# Patient Record
Sex: Male | Born: 1950
Health system: Southern US, Community
[De-identification: ages and names within clinical notes are randomized; demographics above are authoritative.]

## PROBLEM LIST (undated history)

## (undated) DIAGNOSIS — E11621 Type 2 diabetes mellitus with foot ulcer: Secondary | ICD-10-CM

## (undated) DIAGNOSIS — G4730 Sleep apnea, unspecified: Secondary | ICD-10-CM

## (undated) DIAGNOSIS — I890 Lymphedema, not elsewhere classified: Secondary | ICD-10-CM

## (undated) DIAGNOSIS — I10 Essential (primary) hypertension: Secondary | ICD-10-CM

## (undated) DIAGNOSIS — A419 Sepsis, unspecified organism: Secondary | ICD-10-CM

## (undated) DIAGNOSIS — E7800 Pure hypercholesterolemia, unspecified: Secondary | ICD-10-CM

## (undated) DIAGNOSIS — R7881 Bacteremia: Secondary | ICD-10-CM

## (undated) DIAGNOSIS — L97509 Non-pressure chronic ulcer of other part of unspecified foot with unspecified severity: Secondary | ICD-10-CM

## (undated) DIAGNOSIS — E1151 Type 2 diabetes mellitus with diabetic peripheral angiopathy without gangrene: Secondary | ICD-10-CM

## (undated) DIAGNOSIS — R6521 Severe sepsis with septic shock: Secondary | ICD-10-CM

## (undated) DIAGNOSIS — E669 Obesity, unspecified: Secondary | ICD-10-CM

## (undated) HISTORY — DX: Essential (primary) hypertension: I10

## (undated) HISTORY — DX: Obesity, unspecified: E66.9

## (undated) HISTORY — DX: Sleep apnea, unspecified: G47.30

## (undated) HISTORY — PX: KNEE SURGERY: SHX244

## (undated) HISTORY — PX: HEMORRHOID SURGERY: SHX153

---

## 2002-01-03 ENCOUNTER — Observation Stay (HOSPITAL_COMMUNITY): Admission: EM | Admit: 2002-01-03 | Discharge: 2002-01-04 | Payer: Self-pay | Admitting: Emergency Medicine

## 2002-01-03 ENCOUNTER — Emergency Department (HOSPITAL_COMMUNITY): Admission: EM | Admit: 2002-01-03 | Discharge: 2002-01-03 | Payer: Self-pay | Admitting: Emergency Medicine

## 2002-01-03 ENCOUNTER — Encounter: Payer: Self-pay | Admitting: Emergency Medicine

## 2003-07-22 ENCOUNTER — Encounter (HOSPITAL_BASED_OUTPATIENT_CLINIC_OR_DEPARTMENT_OTHER): Admission: RE | Admit: 2003-07-22 | Discharge: 2003-09-10 | Payer: Self-pay | Admitting: Internal Medicine

## 2005-01-04 ENCOUNTER — Encounter (HOSPITAL_COMMUNITY): Admission: RE | Admit: 2005-01-04 | Discharge: 2005-02-03 | Payer: Self-pay | Admitting: Family Medicine

## 2005-01-05 ENCOUNTER — Ambulatory Visit (HOSPITAL_COMMUNITY): Admission: RE | Admit: 2005-01-05 | Discharge: 2005-01-05 | Payer: Self-pay | Admitting: Family Medicine

## 2006-10-05 ENCOUNTER — Inpatient Hospital Stay (HOSPITAL_COMMUNITY): Admission: EM | Admit: 2006-10-05 | Discharge: 2006-10-10 | Payer: Self-pay | Admitting: Emergency Medicine

## 2006-10-11 ENCOUNTER — Encounter (HOSPITAL_COMMUNITY): Admission: RE | Admit: 2006-10-11 | Discharge: 2006-11-10 | Payer: Self-pay | Admitting: Family Medicine

## 2006-10-18 ENCOUNTER — Ambulatory Visit (HOSPITAL_COMMUNITY): Admission: RE | Admit: 2006-10-18 | Discharge: 2006-10-18 | Payer: Self-pay | Admitting: Podiatry

## 2006-11-14 ENCOUNTER — Encounter (HOSPITAL_COMMUNITY): Admission: RE | Admit: 2006-11-14 | Discharge: 2006-12-14 | Payer: Self-pay | Admitting: Family Medicine

## 2007-09-16 ENCOUNTER — Ambulatory Visit (HOSPITAL_COMMUNITY): Admission: RE | Admit: 2007-09-16 | Discharge: 2007-09-16 | Payer: Self-pay | Admitting: Podiatry

## 2008-02-28 ENCOUNTER — Emergency Department (HOSPITAL_COMMUNITY): Admission: EM | Admit: 2008-02-28 | Discharge: 2008-02-28 | Payer: Self-pay | Admitting: Emergency Medicine

## 2010-11-04 NOTE — H&P (Signed)
Thomas Carey, Thomas Carey             ACCOUNT NO.:  [id number]   MEDICAL RECORD NO.:  [id number]          PATIENT TYPE:  INP   LOCATION:  A219                          FACILITY:  [facility]   PHYSICIAN:  Kirk Ruths, M.D.DATE OF BIRTH:  February 21, 1951   DATE OF ADMISSION:  10/05/2006  DATE OF DISCHARGE:  LH                              HISTORY & PHYSICAL   CHIEF COMPLAINT:  1. Weakness.  2. Chills.  3. Fever.   PRESENTING ILLNESS:  This is a 60 year old morbidly obese patient with  metabolic syndrome and history of diabetic foot ulcers.  The patient  awoke this morning, started feeling weak with chills and rigor and  fever, went to the Urgent Care Center and was subsequently referred to  the ER where he was found to be somewhat hypotensive, blood pressure in  the 80's systolic, weak with an open and draining diabetic ulcer on his  right foot.  The patient has had chronic problems with his foot for  several years.  He is admitted for IV antibiotics and fluids for  possible sepsis.   PAST MEDICAL HISTORY:  He is allergic to no medications.  Currently  takes metformin, TriCor, aspirin, Benicar and Vytorin, doses are  unavailable at the time of this dictation.   REVIEW OF SYSTEMS:  Denies shortness of breath, chest pains, nausea,  vomiting, diarrhea.   SOCIAL HISTORY:  1. Nonsmoker.  2. Nondrinker.   PHYSICAL EXAMINATION:  Morbidly obese male appears miserable.  Temp is  99, blood pressure 103/47, pulse 94, respirations 22, O2 sats 92% on  room air.  HEENT:  TMs are normal.  Pupils equal and react to light and  accommodation.  Oropharynx benign.  NECK:  Supple without JVD, bruit or thyromegaly.  LUNGS:  Clear in all areas.  HEART:  Regular sinus rhythm without murmur, gallop or rub.  ABDOMEN:  Pendulous, nontender.  There is obvious cellulitis in the  right lower extremity and foot with purulent drainage.   ASSESSMENT:  1. Diabetic foot ulcer.  2. Possible sepsis with  hypotension.  3. Metabolic syndrome.      Kirk Ruths, M.D.  Electronically Signed     WMM/MEDQ  D:  10/06/2006  T:  10/06/2006  Job:  [id number]

## 2010-11-04 NOTE — Discharge Summary (Signed)
Thomas Carey, Thomas Carey             ACCOUNT NO.:  [id number]   MEDICAL RECORD NO.:  [id number]          PATIENT TYPE:  INP   LOCATION:  A219                          FACILITY:  [facility]   PHYSICIAN:  Kirk Ruths, M.D.DATE OF BIRTH:  04-Oct-1950   DATE OF ADMISSION:  10/05/2006  DATE OF DISCHARGE:  04/23/2008LH                               DISCHARGE SUMMARY   FINAL DIAGNOSES:  1. Sepsis with hypotension secondary to staph aureus.  2. Diabetic foot ulcer with cellulitis secondary to staph aureus.  3. Type 2 diabetes.  4. Morbid obesity.  5. Hypertension.  6. Hyperlipidemia.   HOSPITAL COURSE:  This 60 year old male has had multiple problems in the  past from a diabetic foot ulcers.  The patient on this occasion was  having some fever and chills; was seen in an Urgent Care Center earlier  on this day.  Developed worsening chills and rigor and weakness.  In the  emergency room, he was found to have a systolic pressure of 80 and a  severe diabetic foot ulcer.  It was felt patient was having septicemia  with hypotension.  He was admitted to the floor; given copious fluids.  The patient was seen in consultation by Dr. Malvin Johns for surgery and  basically between him and Physical Therapy the wound was treated,  debrided, cultured.  The patient subsequently grew out staph aureus in  his blood and his wound.  This was sensitive to most drugs except  penicillin and erythromycin.  The patient had been empirically treated  with IV antibiotics on admission.  Assuming this was a MRSA he had been  started on vancomycin and Rocephin IV.  The vancomycin was discontinued  after sensitivities returned.  The patient's blood pressure returned to  normal by the following day, he was having no complaints.  The patient's  blood sugars were initially up but were controlled through his stay.  His glycosylated hemoglobin was 6.3.  X-ray of his foot showed no  evidence of bony involvement.  Chest x-ray  showed some cardiomegaly.  Echocardiogram to rule out a vegetative lesion of his sepsis was mildly  abnormal but no evidence of septic source.  The patient's initial white  count was 13.1 with a hemoglobin of 13.4.  Electrolytes were within  normal range.  The patient reached maximal hospital benefit.  He was  discharged home on Levaquin and he is to continue his wound care at  Physical Therapy Department.  He will followup with Surgery at [facility]  in 1 week.      Kirk Ruths, M.D.  Electronically Signed     WMM/MEDQ  D:  10/29/2006  T:  10/29/2006  Job:  [id number]

## 2010-11-04 NOTE — Consult Note (Signed)
NAME:  Thomas Carey, Thomas Carey                       ACCOUNT NO.:  [id number]   MEDICAL RECORD NO.:  [id number]                   PATIENT TYPE:  REC   LOCATION:  FOOT                                 FACILITY:  [facility]   PHYSICIAN:  Jonelle Sports. Sevier, M.D.              DATE OF BIRTH:  July 08, 1950   DATE OF CONSULTATION:  07/23/2003  DATE OF DISCHARGE:                                   CONSULTATION   HISTORY:  This 60 year old white male comes referred by Carilyn Goodpasture for  assistance with management of a chronic ulcer on the plantar aspect of the  right foot.   The patient is not diabetic but has been overweight for a number of years.  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  Over the past two  years, he has had a tendency toward recurring callus and ulceration on the  right first metatarsal head area.  This was never a matter of great concern.  He would either trim it himself or have it trimmed, and it would seem to  heal over until recently when he awakened in the middle of the night with  shaking chills and was seen at the hospital.  No certain diagnosis  established.  Temperature came down.  He was sent home only to have  recurrence of chills and eventual rupture and drainage of an abscess at the  site of this callus of his first metatarsal head area on the right.  Since  then, he has been seen by two podiatrists with some degree of debridement  and has been kept on antibiotics, initially Augmentin XR and more recently  Levaquin.  His cultures have shown penicillin-resistant but methicillin-  sensitive Staphylococcus aureus.  He has had Doppler studies done which show  entirely normal vascular status in the lower extremities.  The ulcer  persists.  He continues on antibiotics, and he is here today for our further  evaluation and advice.   PAST MEDICAL HISTORY:  1. Hyperlipidemia.  2. History  of fracture of right knee.   ALLERGIES:  No known medicinal allergies.   MEDICATIONS:  His only medication is the Levaquin which is to continue  approximately one additional week.   PHYSICAL EXAMINATION:  Examination today is limited to the distal lower  extremities.  The feet are without gross deformity, although he does have  some degree of high arches and clawing of the toes bilaterally.  His skin  temperatures are normal and essentially symmetrical.  There is no certain  edema, although he has thick legs and ankles.  Pulses are everywhere  palpable and adequate.  There is good hair growth on the toe.  Monofilament  testing shows that he apparently lacks protective sensation about his  forefeet.  The basis for this is not entirely clear.  There is mild  hyperkeratosis of the heels and several other pressure areas bilaterally,  but the only significant callus is indeed under the first metatarsal head on  the right.  There is also a linear ulceration measuring approximately 10 x 3  x 1.5 mm.  This does not probe deeply, and there is no suspicion at this  point of bony involvement.  (The patient has been recently x-rayed by a  podiatrist with no evidence of bony involvement.)   DISPOSITION:  1. The patient is given instruction regarding foot care by video with some     reinforcement.  2. The areas of light hyperkeratosis on both feet are gently dremeled     without incident.  3. The callus and ulcer underlying the first metatarsal head on the right is     partial-thickness debrided with elimination of most of the callus and     with removal of some crusty nonviable tissue from the wound margin     itself.  The depth of the wound appears essentially clean.  4. The wound was treated with Bactroban and the application of a Allevyn     pad.  With the patient's permission, he is placed in a total contact cast     on the right lower extremity.   PLAN:  1. Keep him casted until this is  completely healed and then place him in     custom inserts and perhaps shoe with slight rocker configuration in     effort to try to better unload this area on an ongoing basis.  2. Followup visit will be here in six days.                                               Jonelle Sports. Cheryll Cockayne, M.D.    RES/MEDQ  D:  07/23/2003  T:  07/23/2003  Job:  [id number]   cc:   Carilyn Goodpasture, P.A.C.  [facility] [address]  [place], [place] [address]
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
## 2010-11-04 NOTE — Procedures (Signed)
[name], [name] NO.:  [id number]   MEDICAL RECORD NO.:  [id number]          PATIENT TYPE:  OUT   LOCATION:  RAD                           FACILITY:  [facility]   PHYSICIAN:  Dani Gobble, MD       DATE OF BIRTH:  [birth year]   DATE OF PROCEDURE:  01/05/2005  DATE OF DISCHARGE:                                  ECHOCARDIOGRAM   INDICATIONS:  Thomas Carey is a 60 year old gentleman with a past medical  history of possibly hypertension with edema.   Technical quality of the study is quite technically limited secondary to  patient body habitus and poor acoustic windows.  This was particularly true  from the apical window.   RESULTS:  The aorta appears to be within normal limits measured at  approximately 3.6 cm.   Left atrium appears somewhat generous subjectively.  The patient appeared to  be in sinus rhythm during procedure.   The interventricular septum and posterior wall are mild to moderately  thickened.   The aortic valve appeared to be trileaflet with normal leaflet excursion.  No significant aortic insufficiency is noted.  Doppler interrogation of the  aortic valve is within normal limits.   The mitral valve also appears grossly structurally normal.  No mitral  prolapse is noted.  Mild mitral annular calcification noted. Doppler  suggests mild mitral regurgitation, but this is not well quantified due to  the technical limitations of study.   Pulmonic valve is not visualized.   Tricuspid valve is not well-visualized as well.  Mild tricuspid  regurgitation is noted.   The left ventricle is at the upper limits of normal in size with LVIDd  measured at 5.6 cm.  The LVIC is measured at 3.6 cm.  Overall left systolic  function is normal.  The inferior and posterior walls are not well-  visualized.  The remaining walls appeared to contract normally with systole.  There is no evidence for diastolic dysfunction by mitral valve inflow  signal.   The right  atrium is not well-visualized.  The right ventricle appeared  subjectively mildly generous, but with preserved right ventricular  systolic  function.   IMPRESSION:  1.  Technically limited study secondary to patient body habitus with poor      acoustic windows.  2.  Left atrial enlargement.  3.  Mild to moderate concentric left ventricular hypertrophy.  4.  Probably mild mitral regurgitation.  5.  Mild tricuspid regurgitation.  6.  Mild mitral annular calcification.  7.  Left ventricle at the upper limits of normal in size with normal overall      systolic function.  The inferior and posterior walls are not well-      visualized.  8.  The right ventricle subjectively appears generous, but with preserved      right ventricular systolic function.       AB/MEDQ  D:  01/05/2005  T:  01/05/2005  Job:  [id number]

## 2010-11-04 NOTE — Procedures (Signed)
NAME:  Thomas Carey, Thomas Carey NO.:  [id number]   MEDICAL RECORD NO.:  [id number]          PATIENT TYPE:  INP   LOCATION:  A219                          FACILITY:  [facility]   PHYSICIAN:  Dani Gobble, MD       DATE OF BIRTH:  May 05, 1951   DATE OF PROCEDURE:  DATE OF DISCHARGE:                                ECHOCARDIOGRAM   INDICATIONS:  This is a 60 year old gentleman with a past medical  history of diabetes who was admitted with hypotension and possible  sepsis who is referred for evaluation of possible endocarditis.   Technical:  Study is technically limited secondary to patient body  habitus, poor acoustic windows.   The aorta measures mildly dilated at 4.2 cm.   The left atrium is moderately dilated and measured 5.1 cm.  The patient  appeared to be in sinus rhythm during the procedure.  No obvious clots  or masses were appreciated but this study is not adequate for assessment  of such.   The interventricular septum posterior wall is notable for mild to  moderate concentric LVH.   The aortic valve is not well visualized, but is probably trileaflet.  Overall leaflet opening is normal.  No aortic insufficiency is  appreciated.  Doppler interrogation of the aortic valve reveals a peak  velocity of 1.8 meters per second corresponding to a peak gradient 13 mm  for a mean greater than 8 mmHg.   The mitral valve appears grossly structurally normal.  Mitral  regurgitation is present, but difficult to quantify.   Pulmonic valve is not visualized.   Tricuspid valve is also poorly visualized..  There is likely tricuspid  regurgitation present but again unable to quantify on this study.   The left ventricle is at upper limits of normal in size.  The  endocardium is not well visualized but overall ejection fraction appears  to be reasonable.   The right-sided structures were not well visualized, although the right  ventricle did appear somewhat generous with preserved  right ventricular  systolic function.   IMPRESSION:  1. Technically limited study secondary to patient body habitus and      poor acoustic windows.  2. Aorta measures mildly dilated at 4.2 cm.  3. Moderate left atrial enlargement.  4. Mild to moderate concentric left ventricular hypertrophy.  5. Mild aortic sclerosis without hemodynamically significant stenosis.  6. Mitral and tricuspid regurgitation are present but unable to      quantify on this study.  7. Left ventricle at the upper limits of normal in size and      endocardium not well visualized, but overall ejection fraction      appears reasonable.  Unable to assess for regional wall motion      abnormalities on this study.  8. Right ventricle appears somewhat generous, but with preserved right      ventricular systolic function.  9. No obvious embolic source, but this study is not adequate for      assessment of such:  Consider transesophageal echocardiogram if      clinically indicated.  ______________________________  Dani Gobble, MD     AB/MEDQ  D:  10/08/2006  T:  10/09/2006  Job:  [id number]   cc:   Dani Gobble, MD  Fax: [phone]   Kirk Ruths, M.D.  Fax: [phone]

## 2011-03-13 LAB — CREATININE, SERUM
Creatinine, Ser: 1.09
GFR calc Af Amer: >60

## 2011-03-22 LAB — COMPREHENSIVE METABOLIC PANEL
ALT: 18
AST: 23
Calcium: 10.2
Creatinine, Ser: 3.27 — ABNORMAL HIGH
GFR calc Af Amer: 24 — ABNORMAL LOW
Sodium: 140
Total Protein: 6.8

## 2011-03-22 LAB — DIFFERENTIAL
Eosinophils Absolute: 0.1
Eosinophils Relative: 2
Lymphocytes Relative: 24
Lymphs Abs: 2.4
Monocytes Relative: 6
Neutrophils Relative %: 69

## 2011-03-22 LAB — CBC
MCHC: 33.7
MCV: 94.5
Platelets: 256
RDW: 13.6

## 2012-06-19 HISTORY — PX: FOOT SURGERY: SHX648

## 2013-03-17 ENCOUNTER — Telehealth (HOSPITAL_COMMUNITY): Payer: Self-pay | Admitting: Dietician

## 2013-03-17 NOTE — Telephone Encounter (Signed)
Received referral via fax from [facility] for dx: morbid obesity.

## 2013-03-17 NOTE — Telephone Encounter (Signed)
Called and left message on voicemail at [phone].

## 2013-03-17 NOTE — Telephone Encounter (Signed)
Received call back from pt at 1600. Pt scheduled for 03/25/13 at 1530.

## 2013-03-25 ENCOUNTER — Encounter (HOSPITAL_COMMUNITY): Payer: Self-pay | Admitting: Dietician

## 2013-03-25 DIAGNOSIS — E669 Obesity, unspecified: Secondary | ICD-10-CM | POA: Insufficient documentation

## 2013-03-25 DIAGNOSIS — E1151 Type 2 diabetes mellitus with diabetic peripheral angiopathy without gangrene: Secondary | ICD-10-CM | POA: Insufficient documentation

## 2013-03-25 DIAGNOSIS — I10 Essential (primary) hypertension: Secondary | ICD-10-CM | POA: Insufficient documentation

## 2013-03-25 DIAGNOSIS — G4730 Sleep apnea, unspecified: Secondary | ICD-10-CM | POA: Insufficient documentation

## 2013-03-25 DIAGNOSIS — E66813 Obesity, class 3: Secondary | ICD-10-CM | POA: Insufficient documentation

## 2013-03-25 NOTE — Progress Notes (Signed)
Outpatient Initial Nutrition Assessment  Date:03/25/2013   Appt Start Time: 1523  Referring Physician: Robbie Lis Medical Reason for Visit: obesity, diabetes  Nutrition Assessment:  Height: 6' (182.9 cm)   Weight: 374 lb (169.645 kg)   IBW: 178# %IBW: 210# UBW: 360# %UBW: 104%  Body mass index is 50.71 kg/(m^2). Meets criteria for extreme obesity, class III. Goal Weight: 337# (10% loss of current body weight) Weight hx: Pt reports UBW of 360# for the past 3 years. He was 330# about 10 years ago. He reports he was in the 200's when he was in his 20's. He reports he has struggled with his weight his entire life, ebing overweight as a child as well.   Estimated nutritional needs:  Kcals/ day: 2500-2600 Protein (grams)/day: 136-170 Fluid (L)/ day: 2.5-2.6  PMH:  Past Medical History  Diagnosis Date  . DM (diabetes mellitus)   . Obesity   . Sleep apnea   . HTN (hypertension)     Medications:  Current Outpatient Rx  Name  Route  Sig  Dispense  Refill  . aspirin 81 MG tablet   Oral   Take 81 mg by mouth daily.         Marland Kitchen atorvastatin (LIPITOR) 40 MG tablet   Oral   Take 40 mg by mouth daily.         . furosemide (LASIX) 20 MG tablet   Oral   Take 20 mg by mouth 2 (two) times daily.         Marland Kitchen gemfibrozil (LOPID) 600 MG tablet   Oral   Take 600 mg by mouth 2 (two) times daily before a meal.         . lisinopril-hydrochlorothiazide (PRINZIDE,ZESTORETIC) 20-12.5 MG per tablet   Oral   Take 1 tablet by mouth daily.         . metFORMIN (GLUCOPHAGE) 500 MG tablet   Oral   Take 500 mg by mouth 2 (two) times daily with a meal.         . potassium chloride SA (K-DUR,KLOR-CON) 20 MEQ tablet   Oral   Take 20 mEq by mouth 2 (two) times daily.           Labs: CMP     Component Value Date/Time   NA 140 02/28/2008 1805   K 4.1 02/28/2008 1805   CL 105 02/28/2008 1805   CO2 29 02/28/2008 1805   GLUCOSE 113* 02/28/2008 1805   BUN 40* 02/28/2008 1805   CREATININE 3.27*  02/28/2008 1805   CALCIUM 10.2 02/28/2008 1805   PROT 6.8 02/28/2008 1805   ALBUMIN 4.1 02/28/2008 1805   AST 23 02/28/2008 1805   ALT 18 02/28/2008 1805   ALKPHOS 49 02/28/2008 1805   BILITOT 1.1 02/28/2008 1805   GFRNONAA 19* 02/28/2008 1805   GFRAA  Value: 24        The eGFR has been calculated using the MDRD equation. This calculation has not been validated in all clinical* 02/28/2008 1805    Lipid Panel  No results found for this basename: chol, trig, hdl, cholhdl, vldl, ldlcalc     No results found for this basename: HGBA1C   Lab Results  Component Value Date   CREATININE 3.27* 02/28/2008    Per [facility] records (11/27/12): last Hgb A1c: 6.0. Na: 134, K: 4.5, CO2: 95, Cl: 29. BUN: 24, Creat: 1.77, Glucose: 92.   Lifestyle/ social habits: Mr. Mirabile is a very pleasant gentleman who resides alone in [place]. He  has been on disability for 4 years; previous occupation was working in [employer], which he did for 30 years. He reports a strong support system in his older sister, although he describes her as "an enabler" as she does not stop him from going to restaurants. He reports minimal socialization, revealing that his two failed marriages prevented him from having a social life. He is physically inactive, spending most of his day sitting in front of the television. He shares with this RD that he feels like he may have depression. He reports that pains in his knees and legs as well as chronic wounds and callouses on his feet prevent him from being as active as he would like. He has a [employer], but reports that he has only been there a few times in the past several years, although, he reports "I felt good when I went there".    Nutrition hx/habits: Mr. Acy reports that he lacks motivation to make lifestyle changes. He reports he has been a procrastinator his whole life, describing himself as "lazy". He reveals that he turns to food as comfort and feels like he is an addict.  He has tried "mental bribery" to help him achieve a healthier lifestyle, but he always returns to food as comfort. He states "all my life I say I will start tomorrow, but I never do".  Additionally, he reports lack of self management of his diabetes. He reports his does not test his blood sugars and does not follow a diabetic diet. He shares with this RD that he was referred here in the past for diabetes education, but did not go because he did not think he needed to. He reports "nothing is guaranteed. Losing weight and having a better diet could help me, but then again, it might not".  He reports many negative impacts of his obesity, including knee and joint pain, limited mobility, and having to sit at stools at restaurants due to fear of not being able to get up. He shared with this RD a recent experience where he used a restaurant table to steady himself and two waitresses came running towards him to hold down the table, as they were afraid he would fall. He states "stuff like that is really embarrassing".  His diet is limited to processed foods and eating out. When at home, he reports eating microwave dinners, canned soup, and frozen vegetables. He eats out as much as 3 times per day, mainly at fast food or buffet restaurants. He has been trying to drink more water and decrease his portions.  He reports he was given a diet handout from [facility] called the "Whole 30 Diet". He reports "I can't do that; it's too limited. I love foods like beans and rice and they're telling me I can't have that. It's not realistic".   Diet recall: Breakfast: sausage, egg, and cheese croissant, hash browns, and sweet tea from [employer]; Lunch: burger and chili from [employer]; Dinner: omelet OR marinated sirloin, OR smothered chicken, with vegetables, and baked potato from [employer].   Nutrition Diagnosis: Self-monitoring deficit r/t lack of motivation AEB pt interview.   Nutrition Intervention: Nutrition rx:  2000 kcal NAS, no sugar added diet; 3 meals per day (limit 1 starch per meal); no snacking; low calorie beverages only; physical activity as tolerated  Education/Counseling Provided: The majority of the visit was spent on counseling to identify barriers and encourage pt to make positive lifestyle changes. Discussed making small changes to his  lifestyle (such as being more active and choosing healthier choices) can positively impact his health. Discussed making small, reasonable goals, such as decreasing body weight by 10% and becoming more physically active. Discussed appropriate recommendations for physical activity, as well as the types of activities pt enjoyed doing. Encouraged pt to work up to activity goals.  Reviewed diet recall and discussed ways to choose healthier items at restaurants. Also discussed ways to decrease calorie intake while dining out. Educated pt on the plate method and discussed choosing lean meats, fruits, vegetables, low fat dairy, and whole grains most often. Discussed portion sizes. Discussed ways a healthier lifestyle could improve his quality of life and encouraged pt to think about changes he was willing to make at this time. Pt declined handouts at this time. Teachback method used.   Understanding, Motivation, Ability to Follow Recommendations: Expect fair to poor compliance.  Monitoring and Evaluation: Goals: 1) 0.5-2# wt loss per week; 2) Physical activity as tolerated  Recommendations: 1) For weight loss: 1800-2000 kcals daily; 2) Go to the [facility] for socialization; 3) Seek out other hobbies and interests; 4) Choose healthier side items (ex baked potato or salad instead of fries); 5) Choose kid sized or dollar menu entrees OR eat half of entree and save rest for another meal; 6) Strongly recommend referral to a mental health professional  F/U: PRN. Pt declined follow-up.   Ngoc Detjen A. Mayford Knife, RD, LDN 03/25/2013  Appt [id number]

## 2013-06-24 ENCOUNTER — Other Ambulatory Visit: Payer: Self-pay | Admitting: Urology

## 2013-06-24 ENCOUNTER — Ambulatory Visit (INDEPENDENT_AMBULATORY_CARE_PROVIDER_SITE_OTHER): Payer: Medicare Other | Admitting: Urology

## 2013-06-24 DIAGNOSIS — R972 Elevated prostate specific antigen [PSA]: Secondary | ICD-10-CM

## 2013-06-24 DIAGNOSIS — N400 Enlarged prostate without lower urinary tract symptoms: Secondary | ICD-10-CM

## 2013-07-15 ENCOUNTER — Ambulatory Visit (HOSPITAL_COMMUNITY)
Admission: RE | Admit: 2013-07-15 | Discharge: 2013-07-15 | Disposition: A | Discharge status: Home / Self Care | Payer: Medicare Other | Source: Ambulatory Visit | Attending: Urology | Admitting: Urology

## 2013-07-15 DIAGNOSIS — N419 Inflammatory disease of prostate, unspecified: Secondary | ICD-10-CM | POA: Insufficient documentation

## 2013-07-15 DIAGNOSIS — R972 Elevated prostate specific antigen [PSA]: Secondary | ICD-10-CM

## 2013-07-15 MED ORDER — GENTAMICIN SULFATE 40 MG/ML IJ SOLN
160.0000 mg | Freq: Once | INTRAMUSCULAR | Status: AC
  Administered 2013-07-15: 160 mg via INTRAMUSCULAR

## 2013-07-15 MED ORDER — GENTAMICIN SULFATE 40 MG/ML IJ SOLN
INTRAMUSCULAR | Status: AC
  Administered 2013-07-15: 160 mg via INTRAMUSCULAR
  Filled 2013-07-15: qty 4

## 2013-07-15 MED ORDER — LIDOCAINE HCL (PF) 2 % IJ SOLN
INTRAMUSCULAR | Status: AC
  Administered 2013-07-15: 10 mL
  Filled 2013-07-15: qty 10

## 2013-07-15 MED ORDER — LIDOCAINE HCL (PF) 2 % IJ SOLN
10.0000 mL | Freq: Once | INTRAMUSCULAR | Status: AC
  Administered 2013-07-15: 10 mL

## 2013-07-15 NOTE — Discharge Instructions (Signed)
Prostate Biopsy TRUS Biopsy BEFORE THE TEST   Do not take aspirin. Do not take any medicine that has aspirin in it 7 days before your biopsy.  You may be given a medicine to take on the day of your biopsy.  You may also be given a medicine or treatment to help you go poop (laxative or enema). AFTER THE TEST  Only take medicine as told by your doctor.  It is normal to have some bleeding from your rectum for the first 5 days.  You may have blood in your pee (urine) or sperm. Finding out the results of your test Ask when your test results will be ready. Make sure you get your test results. GET HELP RIGHT AWAY IF:  You have a temperature by mouth above 102 F (38.9 C), not controlled by medicine.  You have blood in your pee for more than 5 days.  You have a lot of blood in your pee.  You have bleeding from your rectum for more than 5 days or have a lot of blood in your poop (feces).  You have severe pain. Document Released: 05/24/2009 Document Revised: 08/28/2011 Document Reviewed: 01/22/2013 [facility] Patient Information 2014 [place], [place].

## 2013-07-25 ENCOUNTER — Encounter: Payer: Self-pay | Admitting: Urology

## 2014-01-13 ENCOUNTER — Ambulatory Visit (INDEPENDENT_AMBULATORY_CARE_PROVIDER_SITE_OTHER): Payer: Medicare Other | Admitting: Urology

## 2014-01-13 DIAGNOSIS — N400 Enlarged prostate without lower urinary tract symptoms: Secondary | ICD-10-CM

## 2014-01-13 DIAGNOSIS — R972 Elevated prostate specific antigen [PSA]: Secondary | ICD-10-CM

## 2014-05-25 ENCOUNTER — Inpatient Hospital Stay (HOSPITAL_COMMUNITY)
Admission: EM | Admit: 2014-05-25 | Discharge: 2014-06-02 | DRG: 853 | Disposition: A | Discharge status: Skilled Nursing Facility | Payer: Medicare Other | Attending: Internal Medicine | Admitting: Internal Medicine

## 2014-05-25 ENCOUNTER — Emergency Department (HOSPITAL_COMMUNITY): Payer: Medicare Other

## 2014-05-25 ENCOUNTER — Encounter (HOSPITAL_COMMUNITY): Payer: Self-pay | Admitting: *Deleted

## 2014-05-25 DIAGNOSIS — R600 Localized edema: Secondary | ICD-10-CM

## 2014-05-25 DIAGNOSIS — R7881 Bacteremia: Secondary | ICD-10-CM | POA: Diagnosis present

## 2014-05-25 DIAGNOSIS — A419 Sepsis, unspecified organism: Principal | ICD-10-CM | POA: Diagnosis present

## 2014-05-25 DIAGNOSIS — J9811 Atelectasis: Secondary | ICD-10-CM | POA: Diagnosis present

## 2014-05-25 DIAGNOSIS — D696 Thrombocytopenia, unspecified: Secondary | ICD-10-CM | POA: Diagnosis present

## 2014-05-25 DIAGNOSIS — Z6841 Body Mass Index (BMI) 40.0 and over, adult: Secondary | ICD-10-CM

## 2014-05-25 DIAGNOSIS — M869 Osteomyelitis, unspecified: Secondary | ICD-10-CM

## 2014-05-25 DIAGNOSIS — R069 Unspecified abnormalities of breathing: Secondary | ICD-10-CM

## 2014-05-25 DIAGNOSIS — Z452 Encounter for adjustment and management of vascular access device: Secondary | ICD-10-CM

## 2014-05-25 DIAGNOSIS — G4733 Obstructive sleep apnea (adult) (pediatric): Secondary | ICD-10-CM | POA: Diagnosis present

## 2014-05-25 DIAGNOSIS — E869 Volume depletion, unspecified: Secondary | ICD-10-CM | POA: Diagnosis present

## 2014-05-25 DIAGNOSIS — L97529 Non-pressure chronic ulcer of other part of left foot with unspecified severity: Secondary | ICD-10-CM | POA: Diagnosis present

## 2014-05-25 DIAGNOSIS — I4891 Unspecified atrial fibrillation: Secondary | ICD-10-CM | POA: Diagnosis present

## 2014-05-25 DIAGNOSIS — L03115 Cellulitis of right lower limb: Secondary | ICD-10-CM | POA: Diagnosis present

## 2014-05-25 DIAGNOSIS — B9689 Other specified bacterial agents as the cause of diseases classified elsewhere: Secondary | ICD-10-CM | POA: Diagnosis present

## 2014-05-25 DIAGNOSIS — R000 Tachycardia, unspecified: Secondary | ICD-10-CM | POA: Diagnosis present

## 2014-05-25 DIAGNOSIS — Z7982 Long term (current) use of aspirin: Secondary | ICD-10-CM

## 2014-05-25 DIAGNOSIS — G4730 Sleep apnea, unspecified: Secondary | ICD-10-CM | POA: Diagnosis present

## 2014-05-25 DIAGNOSIS — L97509 Non-pressure chronic ulcer of other part of unspecified foot with unspecified severity: Secondary | ICD-10-CM

## 2014-05-25 DIAGNOSIS — E669 Obesity, unspecified: Secondary | ICD-10-CM | POA: Diagnosis present

## 2014-05-25 DIAGNOSIS — K089 Disorder of teeth and supporting structures, unspecified: Secondary | ICD-10-CM | POA: Diagnosis present

## 2014-05-25 DIAGNOSIS — R0602 Shortness of breath: Secondary | ICD-10-CM

## 2014-05-25 DIAGNOSIS — L97519 Non-pressure chronic ulcer of other part of right foot with unspecified severity: Secondary | ICD-10-CM | POA: Diagnosis present

## 2014-05-25 DIAGNOSIS — L03116 Cellulitis of left lower limb: Secondary | ICD-10-CM | POA: Diagnosis not present

## 2014-05-25 DIAGNOSIS — R0989 Other specified symptoms and signs involving the circulatory and respiratory systems: Secondary | ICD-10-CM

## 2014-05-25 DIAGNOSIS — I493 Ventricular premature depolarization: Secondary | ICD-10-CM | POA: Diagnosis present

## 2014-05-25 DIAGNOSIS — E1151 Type 2 diabetes mellitus with diabetic peripheral angiopathy without gangrene: Secondary | ICD-10-CM | POA: Diagnosis present

## 2014-05-25 DIAGNOSIS — E1142 Type 2 diabetes mellitus with diabetic polyneuropathy: Secondary | ICD-10-CM | POA: Diagnosis present

## 2014-05-25 DIAGNOSIS — I10 Essential (primary) hypertension: Secondary | ICD-10-CM | POA: Diagnosis present

## 2014-05-25 DIAGNOSIS — J9601 Acute respiratory failure with hypoxia: Secondary | ICD-10-CM | POA: Diagnosis present

## 2014-05-25 DIAGNOSIS — R6521 Severe sepsis with septic shock: Secondary | ICD-10-CM | POA: Diagnosis present

## 2014-05-25 DIAGNOSIS — I890 Lymphedema, not elsewhere classified: Secondary | ICD-10-CM

## 2014-05-25 DIAGNOSIS — E66813 Obesity, class 3: Secondary | ICD-10-CM | POA: Diagnosis present

## 2014-05-25 DIAGNOSIS — N508 Other specified disorders of male genital organs: Secondary | ICD-10-CM | POA: Diagnosis present

## 2014-05-25 DIAGNOSIS — E11621 Type 2 diabetes mellitus with foot ulcer: Secondary | ICD-10-CM | POA: Diagnosis present

## 2014-05-25 HISTORY — DX: Lymphedema, not elsewhere classified: I89.0

## 2014-05-25 HISTORY — DX: Type 2 diabetes mellitus with diabetic peripheral angiopathy without gangrene: E11.51

## 2014-05-25 HISTORY — DX: Non-pressure chronic ulcer of other part of unspecified foot with unspecified severity: L97.509

## 2014-05-25 HISTORY — DX: Severe sepsis with septic shock: R65.21

## 2014-05-25 HISTORY — DX: Sepsis, unspecified organism: A41.9

## 2014-05-25 HISTORY — DX: Bacteremia: R78.81

## 2014-05-25 HISTORY — DX: Type 2 diabetes mellitus with foot ulcer: E11.621

## 2014-05-25 LAB — CBC WITH DIFFERENTIAL/PLATELET
Basophils Absolute: 0 10*3/uL (ref 0.0–0.1)
Basophils Relative: 0 % (ref 0–1)
Eosinophils Absolute: 0 10*3/uL (ref 0.0–0.7)
Eosinophils Relative: 0 % (ref 0–5)
HCT: 42.5 % (ref 39.0–52.0)
Hemoglobin: 14.2 g/dL (ref 13.0–17.0)
LYMPHS ABS: 0.3 10*3/uL — AB (ref 0.7–4.0)
LYMPHS PCT: 2 % — AB (ref 12–46)
MCH: 32.3 pg (ref 26.0–34.0)
MCHC: 33.4 g/dL (ref 30.0–36.0)
MCV: 96.6 fL (ref 78.0–100.0)
Monocytes Absolute: 0.4 10*3/uL (ref 0.1–1.0)
Monocytes Relative: 3 % (ref 3–12)
NEUTROS PCT: 95 % — AB (ref 43–77)
Neutro Abs: 14.8 10*3/uL — ABNORMAL HIGH (ref 1.7–7.7)
PLATELETS: 170 10*3/uL (ref 150–400)
RBC: 4.4 MIL/uL (ref 4.22–5.81)
RDW: 14 % (ref 11.5–15.5)
WBC: 15.5 10*3/uL — AB (ref 4.0–10.5)

## 2014-05-25 LAB — COMPREHENSIVE METABOLIC PANEL
ALBUMIN: 3.4 g/dL — AB (ref 3.5–5.2)
ALK PHOS: 81 U/L (ref 39–117)
ALT: 10 U/L (ref 0–53)
AST: 19 U/L (ref 0–37)
Anion gap: 17 — ABNORMAL HIGH (ref 5–15)
BUN: 23 mg/dL (ref 6–23)
CO2: 23 mEq/L (ref 19–32)
Calcium: 9.4 mg/dL (ref 8.4–10.5)
Chloride: 97 mEq/L (ref 96–112)
Creatinine, Ser: 1.24 mg/dL (ref 0.50–1.35)
GFR calc Af Amer: 69 mL/min — ABNORMAL LOW (ref >90)
GFR calc non Af Amer: 59 mL/min — ABNORMAL LOW (ref >90)
Glucose, Bld: 153 mg/dL — ABNORMAL HIGH (ref 70–99)
POTASSIUM: 4.3 meq/L (ref 3.7–5.3)
SODIUM: 137 meq/L (ref 137–147)
TOTAL PROTEIN: 7.2 g/dL (ref 6.0–8.3)
Total Bilirubin: 0.6 mg/dL (ref 0.3–1.2)

## 2014-05-25 LAB — BLOOD GAS, ARTERIAL
Acid-Base Excess: 1.2 mmol/L (ref 0.0–2.0)
BICARBONATE: 24.4 meq/L — AB (ref 20.0–24.0)
Drawn by: 317771
O2 Content: 4 L/min
O2 Saturation: 94.1 %
PCO2 ART: 32.4 mmHg — AB (ref 35.0–45.0)
PH ART: 7.489 — AB (ref 7.350–7.450)
Patient temperature: 37
TCO2: 21.1 mmol/L (ref 0–100)
pO2, Arterial: 64.2 mmHg — ABNORMAL LOW (ref 80.0–100.0)

## 2014-05-25 LAB — I-STAT CG4 LACTIC ACID, ED: Lactic Acid, Venous: 4.12 mmol/L — ABNORMAL HIGH (ref 0.5–2.2)

## 2014-05-25 LAB — CBG MONITORING, ED: GLUCOSE-CAPILLARY: 151 mg/dL — AB (ref 70–99)

## 2014-05-25 LAB — PROTIME-INR
INR: 1.17 (ref 0.00–1.49)
PROTHROMBIN TIME: 15 s (ref 11.6–15.2)

## 2014-05-25 MED ORDER — LIDOCAINE-EPINEPHRINE (PF) 1 %-1:200000 IJ SOLN
INTRAMUSCULAR | Status: AC
  Filled 2014-05-25: qty 10

## 2014-05-25 MED ORDER — PIPERACILLIN-TAZOBACTAM 3.375 G IVPB 30 MIN
3.3750 g | Freq: Once | INTRAVENOUS | Status: AC
  Administered 2014-05-26: 3.375 g via INTRAVENOUS
  Filled 2014-05-25: qty 50

## 2014-05-25 MED ORDER — VANCOMYCIN HCL IN DEXTROSE 1-5 GM/200ML-% IV SOLN
1000.0000 mg | Freq: Once | INTRAVENOUS | Status: AC
  Administered 2014-05-25: 1000 mg via INTRAVENOUS
  Filled 2014-05-25: qty 200

## 2014-05-25 MED ORDER — ACETAMINOPHEN 500 MG PO TABS
1000.0000 mg | ORAL_TABLET | Freq: Once | ORAL | Status: AC
  Administered 2014-05-25: 1000 mg via ORAL
  Filled 2014-05-25: qty 2

## 2014-05-25 MED ORDER — SODIUM CHLORIDE 0.9 % IV BOLUS (SEPSIS)
1000.0000 mL | Freq: Once | INTRAVENOUS | Status: AC
  Administered 2014-05-25: 1000 mL via INTRAVENOUS

## 2014-05-25 MED ORDER — SODIUM CHLORIDE 0.9 % IV SOLN
5000.0000 mL | Freq: Once | INTRAVENOUS | Status: AC
  Administered 2014-05-25: 5000 mL via INTRAVENOUS
  Filled 2014-05-25: qty 5000

## 2014-05-25 NOTE — ED Notes (Signed)
Pt c/o chills and generalized weakness that started today

## 2014-05-25 NOTE — ED Provider Notes (Signed)
CSN: [id number]     Arrival date & time 05/25/14  2020 History  This chart was scribed for Thomas Acosta, MD by [name] Citizen, ED Scribe. This patient was seen in room APA18/APA18 and the patient's care was started at 9:02 PM.    Chief Complaint  Patient presents with  . Weakness   The history is provided by the patient. No language interpreter was used.     HPI Comments: Thomas Carey is a 63 y.o. male with past medical history of DM, morbid obesity, sleep apnea, HTN who presents to the Emergency Department complaining of 1 day of weakness and SOB. He also reports "shaking," chills, and a subjective fever of 104 at home. Patient reports slight difficulty urinating this morning. He denies other urinary symptoms, diarrhea, nausea, vomiting. He took Tylenol this morning when he began shaking.   Patient reports that he is developing an ulcer on the bottom of his left foot; he notes that the redness and swelling present is baseline for him. He does think that his right foot swells more than his left. He reports chronic leg swelling.   Brother explains that patient lives at home in general disarray; chromically immobile/sedentary and solitary lifestyle.   The symptoms were acute in onset, severe, rapidly progressive, associated with mild shortness of breath but no dysuria diarrhea seizures or headaches.  Past Medical History  Diagnosis Date  . DM (diabetes mellitus)   . Obesity   . Sleep apnea   . HTN (hypertension)    History reviewed. No pertinent past surgical history. Family History  Problem Relation Age of Onset  . Heart disease Brother    History  Substance Use Topics  . Smoking status: Never Smoker   . Smokeless tobacco: Not on file  . Alcohol Use: No    Review of Systems  Constitutional: Positive for fever.  Skin: Positive for wound (Ulcer on bottom of left foot).  Neurological: Positive for weakness.  All other systems reviewed and are negative.     Allergies   Review of patient's allergies indicates no known allergies.  Home Medications   Prior to Admission medications   Medication Sig Start Date End Date Taking? Authorizing Provider  aspirin EC 81 MG tablet Take 81 mg by mouth every evening.   Yes Historical Provider, MD  atorvastatin (LIPITOR) 40 MG tablet Take 40 mg by mouth daily.   Yes Historical Provider, MD  furosemide (LASIX) 20 MG tablet Take 20 mg by mouth 2 (two) times daily. *May take one additional tablet as needed for fluid retention   Yes Historical Provider, MD  gemfibrozil (LOPID) 600 MG tablet Take 600 mg by mouth 2 (two) times daily before a meal.   Yes Historical Provider, MD  lisinopril-hydrochlorothiazide (PRINZIDE,ZESTORETIC) 20-12.5 MG per tablet Take 1 tablet by mouth daily.   Yes Historical Provider, MD  metFORMIN (GLUCOPHAGE) 500 MG tablet Take 250 mg by mouth 2 (two) times daily with a meal.    Yes Historical Provider, MD  potassium chloride SA (K-DUR,KLOR-CON) 20 MEQ tablet Take 20 mEq by mouth 2 (two) times daily.   Yes Historical Provider, MD   BP 84/49 mmHg  Pulse 83  Temp(Src) 99.7 F (37.6 C) (Oral)  Resp 17  Ht 6\' 1"  (1.854 m)  Wt 370 lb (167.831 kg)  BMI 48.83 kg/m2  SpO2 94% Physical Exam  Constitutional: He appears well-developed and well-nourished. He appears distressed.  Uncomfortable appearing  HENT:  Head: Normocephalic and atraumatic.  Mouth/Throat:  Oropharynx is clear and moist. No oropharyngeal exudate.  MM dry  Eyes: Conjunctivae and EOM are normal. Pupils are equal, round, and reactive to light. Right eye exhibits no discharge. Left eye exhibits no discharge. No scleral icterus.  Neck: Normal range of motion. Neck supple. No JVD present. No thyromegaly present.  Cardiovascular: Normal rate, regular rhythm, normal heart sounds and intact distal pulses.  Exam reveals no gallop and no friction rub.   No murmur heard. Low grade tachy  Pulmonary/Chest: Breath sounds normal. He has no wheezes.  He has no rales.  Significant for tachypneic over 30; increased WOB; no rales or wheezing  Abdominal: Soft. Bowel sounds are normal. He exhibits no distension and no mass. There is no tenderness.  Musculoskeletal: Normal range of motion. He exhibits edema. He exhibits no tenderness.  Severe bilateral symmetrical edema below the knees; assymmetry at the feet (L>R) with a hot, red left foot  Lymphadenopathy:    He has no cervical adenopathy.  Neurological: He is alert. Coordination normal.  Skin: Skin is warm and dry. No rash noted. There is erythema.  Psychiatric: He has a normal mood and affect. His behavior is normal.  Nursing note and vitals reviewed.   ED Course  CENTRAL LINE Date/Time: 05/26/2014 12:26 AM Performed by: Noemi Chapel D Authorized by: Noemi Chapel D Consent: The procedure was performed in an emergent situation. Verbal consent obtained. Risks and benefits: risks, benefits and alternatives were discussed Consent given by: patient Patient understanding: patient states understanding of the procedure being performed Required items: required blood products, implants, devices, and special equipment available Patient identity confirmed: verbally with patient Time out: Immediately prior to procedure a "time out" was called to verify the correct patient, procedure, equipment, support staff and site/side marked as required. Indications: vascular access and central pressure monitoring Anesthesia: local infiltration Local anesthetic: lidocaine 1% without epinephrine Anesthetic total: 2 ml Patient sedated: no Preparation: skin prepped with 2% chlorhexidine Skin prep agent dried: skin prep agent completely dried prior to procedure Sterile barriers: all five maximum sterile barriers used - cap, mask, sterile gown, sterile gloves, and large sterile sheet Hand hygiene: hand hygiene performed prior to central venous catheter insertion Location details: right internal  jugular Patient position: Trendelenburg Catheter type: triple lumen Pre-procedure: landmarks identified Ultrasound guidance: yes Sterile ultrasound techniques: sterile gel and sterile probe covers were used Number of attempts: 1 Successful placement: yes Post-procedure: line sutured and dressing applied Assessment: blood return through all ports,  placement verified by x-ray and free fluid flow Patient tolerance: Patient tolerated the procedure well with no immediate complications     DIAGNOSTIC STUDIES: Oxygen Saturation is 97% on RA, adequate by my interpretation.    COORDINATION OF CARE: 9:05 PM Discussed treatment plan with pt at bedside and pt agreed to plan.   Labs Review Labs Reviewed  CBC WITH DIFFERENTIAL - Abnormal; Notable for the following:    WBC 15.5 (*)    Neutrophils Relative % 95 (*)    Neutro Abs 14.8 (*)    Lymphocytes Relative 2 (*)    Lymphs Abs 0.3 (*)    All other components within normal limits  COMPREHENSIVE METABOLIC PANEL - Abnormal; Notable for the following:    Glucose, Bld 153 (*)    Albumin 3.4 (*)    GFR calc non Af Amer 59 (*)    GFR calc Af Amer 69 (*)    Anion gap 17 (*)    All other components within normal limits  BLOOD GAS, ARTERIAL - Abnormal; Notable for the following:    pH, Arterial 7.489 (*)    pCO2 arterial 32.4 (*)    pO2, Arterial 64.2 (*)    Bicarbonate 24.4 (*)    All other components within normal limits  CBG MONITORING, ED - Abnormal; Notable for the following:    Glucose-Capillary 151 (*)    All other components within normal limits  I-STAT CG4 LACTIC ACID, ED - Abnormal; Notable for the following:    Lactic Acid, Venous 4.12 (*)    All other components within normal limits  CULTURE, BLOOD (ROUTINE X 2)  CULTURE, BLOOD (ROUTINE X 2)  URINE CULTURE  PROTIME-INR  URINALYSIS, ROUTINE W REFLEX MICROSCOPIC    Imaging Review Dg Chest Port 1 View  05/25/2014   CLINICAL DATA:  Chills, weakness, and shortness of  breath for 1 day.  EXAM: PORTABLE CHEST - 1 VIEW  COMPARISON:  10/05/2006  FINDINGS: Shallow inspiration. The heart size and mediastinal contours are within normal limits. Both lungs are clear. The visualized skeletal structures are unremarkable.  IMPRESSION: No active disease.   Electronically Signed   By: Lucienne Capers M.D.   On: 05/25/2014 21:30      MDM   Final diagnoses:  Septic shock  Cellulitis of left foot  Encounter for central line care   The patient's presentation was considered consistent with a severe sepsis. During the course of his treatment including the start of vancomycin the patient became more and more hypotensive requiring fluid resuscitation and the addition of a lactic acid. Lactic acid was 4, white blood cell count 15,000, fever and hypotension. He required greater than 5 L of IV fluids to sustain 30 mL/kg bolus according to sepsis protocol, broad-spectrum antibiotics, finally after minimal response with blood pressure I placed a central line in the right internal jugular vein under ultrasound guidance, Levothroid started, the patient will need admission to the intensive care unit. This was discussed with Dr. Shanon Brow who agrees.  Emergency Ultrasound Study:   Angiocath insertion Performed by: Thomas Carey  Consent: Verbal consent obtained. Risks and benefits: risks, benefits and alternatives were discussed Immediately prior to procedure the correct patient, procedure, equipment, support staff and site/side marked as needed.  Indication: difficult IV access Preparation: Patient was prepped and draped in the usual sterile fashion. Vein Location: R Internal Jugular vein was visualized during assessment for potential access sites and was found to be patent/ easily compressed with linear ultrasound.  The needle was visualized with real-time ultrasound and guided into the vein. Gauge: Triple Lumen  Image saved and stored.  Normal blood return.  Patient tolerance:  Patient tolerated the procedure well with no immediate complications.  Central line in appropriate position on x-ray, no pneumothorax.  CRITICAL CARE Performed by: Thomas Carey Total critical care time: 35 Critical care time was exclusive of separately billable procedures and treating other patients. Critical care was necessary to treat or prevent imminent or life-threatening deterioration. Critical care was time spent personally by me on the following activities: development of treatment plan with patient and/or surrogate as well as nursing, discussions with consultants, evaluation of patient's response to treatment, examination of patient, obtaining history from patient or surrogate, ordering and performing treatments and interventions, ordering and review of laboratory studies, ordering and review of radiographic studies, pulse oximetry and re-evaluation of patient's condition.  Meds given in ED:  Medications  lidocaine-EPINEPHrine (XYLOCAINE-EPINEPHrine) 1 %-1:200000 (PF) injection (  Not Given 05/26/14 0028)  piperacillin-tazobactam (ZOSYN)  IVPB 3.375 g (3.375 g Intravenous New Bag/Given 05/26/14 0028)  norepinephrine (LEVOPHED) 4 mg in dextrose 5 % 250 mL (0.016 mg/mL) infusion (not administered)  acetaminophen (TYLENOL) tablet 1,000 mg (1,000 mg Oral Given 05/25/14 2118)  sodium chloride 0.9 % bolus 1,000 mL (0 mLs Intravenous Stopped 05/25/14 2200)  vancomycin (VANCOCIN) IVPB 1000 mg/200 mL premix (0 mg Intravenous Stopped 05/25/14 2300)  0.9 %  sodium chloride infusion (0 mLs Intravenous Stopped 05/26/14 0010)       I personally performed the services described in this documentation, which was scribed in my presence. The recorded information has been reviewed and is accurate.    Thomas Acosta, MD 05/26/14 [phone]

## 2014-05-26 ENCOUNTER — Encounter (HOSPITAL_COMMUNITY): Payer: Self-pay | Admitting: Radiology

## 2014-05-26 ENCOUNTER — Inpatient Hospital Stay (HOSPITAL_COMMUNITY): Payer: Medicare Other

## 2014-05-26 DIAGNOSIS — E869 Volume depletion, unspecified: Secondary | ICD-10-CM | POA: Diagnosis present

## 2014-05-26 DIAGNOSIS — E1142 Type 2 diabetes mellitus with diabetic polyneuropathy: Secondary | ICD-10-CM | POA: Diagnosis present

## 2014-05-26 DIAGNOSIS — I890 Lymphedema, not elsewhere classified: Secondary | ICD-10-CM | POA: Diagnosis present

## 2014-05-26 DIAGNOSIS — G4733 Obstructive sleep apnea (adult) (pediatric): Secondary | ICD-10-CM | POA: Diagnosis present

## 2014-05-26 DIAGNOSIS — J9601 Acute respiratory failure with hypoxia: Secondary | ICD-10-CM | POA: Diagnosis present

## 2014-05-26 DIAGNOSIS — A419 Sepsis, unspecified organism: Secondary | ICD-10-CM | POA: Diagnosis present

## 2014-05-26 DIAGNOSIS — I493 Ventricular premature depolarization: Secondary | ICD-10-CM | POA: Diagnosis present

## 2014-05-26 DIAGNOSIS — L03115 Cellulitis of right lower limb: Secondary | ICD-10-CM | POA: Diagnosis present

## 2014-05-26 DIAGNOSIS — E669 Obesity, unspecified: Secondary | ICD-10-CM

## 2014-05-26 DIAGNOSIS — J9811 Atelectasis: Secondary | ICD-10-CM | POA: Diagnosis present

## 2014-05-26 DIAGNOSIS — D696 Thrombocytopenia, unspecified: Secondary | ICD-10-CM | POA: Diagnosis present

## 2014-05-26 DIAGNOSIS — I10 Essential (primary) hypertension: Secondary | ICD-10-CM | POA: Diagnosis present

## 2014-05-26 DIAGNOSIS — Z7982 Long term (current) use of aspirin: Secondary | ICD-10-CM | POA: Diagnosis not present

## 2014-05-26 DIAGNOSIS — B9689 Other specified bacterial agents as the cause of diseases classified elsewhere: Secondary | ICD-10-CM | POA: Diagnosis present

## 2014-05-26 DIAGNOSIS — G4730 Sleep apnea, unspecified: Secondary | ICD-10-CM

## 2014-05-26 DIAGNOSIS — L97519 Non-pressure chronic ulcer of other part of right foot with unspecified severity: Secondary | ICD-10-CM | POA: Diagnosis present

## 2014-05-26 DIAGNOSIS — L03116 Cellulitis of left lower limb: Secondary | ICD-10-CM | POA: Diagnosis present

## 2014-05-26 DIAGNOSIS — I4891 Unspecified atrial fibrillation: Secondary | ICD-10-CM | POA: Diagnosis present

## 2014-05-26 DIAGNOSIS — R6521 Severe sepsis with septic shock: Secondary | ICD-10-CM | POA: Diagnosis present

## 2014-05-26 DIAGNOSIS — N508 Other specified disorders of male genital organs: Secondary | ICD-10-CM | POA: Diagnosis present

## 2014-05-26 DIAGNOSIS — Z6841 Body Mass Index (BMI) 40.0 and over, adult: Secondary | ICD-10-CM | POA: Diagnosis not present

## 2014-05-26 DIAGNOSIS — E1151 Type 2 diabetes mellitus with diabetic peripheral angiopathy without gangrene: Secondary | ICD-10-CM | POA: Diagnosis present

## 2014-05-26 DIAGNOSIS — L97529 Non-pressure chronic ulcer of other part of left foot with unspecified severity: Secondary | ICD-10-CM | POA: Diagnosis present

## 2014-05-26 DIAGNOSIS — E11621 Type 2 diabetes mellitus with foot ulcer: Secondary | ICD-10-CM | POA: Diagnosis present

## 2014-05-26 HISTORY — DX: Sepsis, unspecified organism: A41.9

## 2014-05-26 LAB — URINALYSIS, ROUTINE W REFLEX MICROSCOPIC
Bilirubin Urine: NEGATIVE
Bilirubin Urine: NEGATIVE
GLUCOSE, UA: NEGATIVE mg/dL
Glucose, UA: NEGATIVE mg/dL
HGB URINE DIPSTICK: NEGATIVE
KETONES UR: NEGATIVE mg/dL
Ketones, ur: NEGATIVE mg/dL
LEUKOCYTES UA: NEGATIVE
Leukocytes, UA: NEGATIVE
NITRITE: NEGATIVE
Nitrite: NEGATIVE
PH: 5.5 (ref 5.0–8.0)
PH: 5 (ref 5.0–8.0)
PROTEIN: NEGATIVE mg/dL
Protein, ur: NEGATIVE mg/dL
Specific Gravity, Urine: 1.01 (ref 1.005–1.030)
Specific Gravity, Urine: 1.025 (ref 1.005–1.030)
Urobilinogen, UA: 0.2 mg/dL (ref 0.0–1.0)
Urobilinogen, UA: 0.2 mg/dL (ref 0.0–1.0)

## 2014-05-26 LAB — URINE MICROSCOPIC-ADD ON

## 2014-05-26 LAB — PRO B NATRIURETIC PEPTIDE: Pro B Natriuretic peptide (BNP): 881.7 pg/mL — ABNORMAL HIGH (ref 0–125)

## 2014-05-26 LAB — CBC
HEMATOCRIT: 38.4 % — AB (ref 39.0–52.0)
HEMOGLOBIN: 12.8 g/dL — AB (ref 13.0–17.0)
MCH: 32.4 pg (ref 26.0–34.0)
MCHC: 33.3 g/dL (ref 30.0–36.0)
MCV: 97.2 fL (ref 78.0–100.0)
Platelets: 151 10*3/uL (ref 150–400)
RBC: 3.95 MIL/uL — ABNORMAL LOW (ref 4.22–5.81)
RDW: 14.3 % (ref 11.5–15.5)
WBC: 12.3 10*3/uL — ABNORMAL HIGH (ref 4.0–10.5)

## 2014-05-26 LAB — BASIC METABOLIC PANEL
Anion gap: 13 (ref 5–15)
BUN: 21 mg/dL (ref 6–23)
CO2: 24 meq/L (ref 19–32)
CREATININE: 1.12 mg/dL (ref 0.50–1.35)
Calcium: 8 mg/dL — ABNORMAL LOW (ref 8.4–10.5)
Chloride: 104 mEq/L (ref 96–112)
GFR calc Af Amer: 78 mL/min — ABNORMAL LOW (ref >90)
GFR calc non Af Amer: 67 mL/min — ABNORMAL LOW (ref >90)
GLUCOSE: 141 mg/dL — AB (ref 70–99)
Potassium: 4 mEq/L (ref 3.7–5.3)
Sodium: 141 mEq/L (ref 137–147)

## 2014-05-26 LAB — INFLUENZA PANEL BY PCR (TYPE A & B)
H1N1 flu by pcr: NOT DETECTED
Influenza A By PCR: NEGATIVE
Influenza B By PCR: NEGATIVE

## 2014-05-26 LAB — TROPONIN I: Troponin I: <0.3 ng/mL (ref <0.30)

## 2014-05-26 LAB — GLUCOSE, CAPILLARY
GLUCOSE-CAPILLARY: 115 mg/dL — AB (ref 70–99)
GLUCOSE-CAPILLARY: 98 mg/dL (ref 70–99)
Glucose-Capillary: 138 mg/dL — ABNORMAL HIGH (ref 70–99)
Glucose-Capillary: 154 mg/dL — ABNORMAL HIGH (ref 70–99)
Glucose-Capillary: 109 mg/dL — ABNORMAL HIGH (ref 70–99)

## 2014-05-26 LAB — BLOOD GAS, ARTERIAL
Acid-base deficit: 0.4 mmol/L (ref 0.0–2.0)
BICARBONATE: 23.4 meq/L (ref 20.0–24.0)
Drawn by: 21310
O2 Content: 3 L/min
O2 Saturation: 93.1 %
PATIENT TEMPERATURE: 37
PH ART: 7.425 (ref 7.350–7.450)
TCO2: 20.6 mmol/L (ref 0–100)
pCO2 arterial: 36.4 mmHg (ref 35.0–45.0)
pO2, Arterial: 67.9 mmHg — ABNORMAL LOW (ref 80.0–100.0)

## 2014-05-26 LAB — MRSA PCR SCREENING: MRSA BY PCR: NEGATIVE

## 2014-05-26 LAB — LACTIC ACID, PLASMA: Lactic Acid, Venous: 1.2 mmol/L (ref 0.5–2.2)

## 2014-05-26 MED ORDER — VANCOMYCIN HCL IN DEXTROSE 1-5 GM/200ML-% IV SOLN
INTRAVENOUS | Status: AC
  Filled 2014-05-26: qty 200

## 2014-05-26 MED ORDER — ALBUTEROL SULFATE (2.5 MG/3ML) 0.083% IN NEBU
2.5000 mg | INHALATION_SOLUTION | Freq: Once | RESPIRATORY_TRACT | Status: AC
  Administered 2014-05-26: 2.5 mg via RESPIRATORY_TRACT
  Filled 2014-05-26: qty 3

## 2014-05-26 MED ORDER — POTASSIUM CHLORIDE IN NACL 20-0.9 MEQ/L-% IV SOLN
INTRAVENOUS | Status: AC
  Administered 2014-05-26: 03:00:00 via INTRAVENOUS

## 2014-05-26 MED ORDER — VANCOMYCIN HCL IN DEXTROSE 1-5 GM/200ML-% IV SOLN
1000.0000 mg | Freq: Once | INTRAVENOUS | Status: AC
  Administered 2014-05-26: 1000 mg via INTRAVENOUS
  Filled 2014-05-26: qty 200

## 2014-05-26 MED ORDER — NOREPINEPHRINE BITARTRATE 1 MG/ML IV SOLN
INTRAVENOUS | Status: AC
  Filled 2014-05-26: qty 4

## 2014-05-26 MED ORDER — ACETAMINOPHEN 325 MG PO TABS
650.0000 mg | ORAL_TABLET | Freq: Four times a day (QID) | ORAL | Status: DC | PRN
  Administered 2014-05-26 – 2014-05-31 (×3): 650 mg via ORAL
  Filled 2014-05-26 (×3): qty 2

## 2014-05-26 MED ORDER — CHLORHEXIDINE GLUCONATE 0.12 % MT SOLN
15.0000 mL | Freq: Two times a day (BID) | OROMUCOSAL | Status: DC
  Administered 2014-05-27 – 2014-05-30 (×8): 15 mL via OROMUCOSAL
  Filled 2014-05-26 (×8): qty 15

## 2014-05-26 MED ORDER — ENOXAPARIN SODIUM 80 MG/0.8ML ~~LOC~~ SOLN
80.0000 mg | SUBCUTANEOUS | Status: DC
  Administered 2014-05-26 – 2014-06-02 (×8): 80 mg via SUBCUTANEOUS
  Filled 2014-05-26 (×8): qty 0.8

## 2014-05-26 MED ORDER — VANCOMYCIN HCL IN DEXTROSE 1-5 GM/200ML-% IV SOLN
1000.0000 mg | Freq: Two times a day (BID) | INTRAVENOUS | Status: DC
  Filled 2014-05-26: qty 200

## 2014-05-26 MED ORDER — ONDANSETRON HCL 4 MG/2ML IJ SOLN: 4.0000 mg | Freq: Four times a day (QID) | INTRAMUSCULAR | Status: DC | PRN

## 2014-05-26 MED ORDER — LORAZEPAM BOLUS VIA INFUSION
0.5000 mg | Freq: Once | INTRAVENOUS | Status: DC
  Filled 2014-05-26: qty 1

## 2014-05-26 MED ORDER — ALBUTEROL (5 MG/ML) CONTINUOUS INHALATION SOLN
5.0000 mg/h | INHALATION_SOLUTION | RESPIRATORY_TRACT | Status: AC
  Administered 2014-05-26: 5 mg/h via RESPIRATORY_TRACT
  Filled 2014-05-26: qty 20

## 2014-05-26 MED ORDER — SODIUM CHLORIDE 0.9 % IV SOLN
INTRAVENOUS | Status: AC
  Filled 2014-05-26: qty 250

## 2014-05-26 MED ORDER — ONDANSETRON HCL 4 MG/2ML IJ SOLN: 4.0000 mg | Freq: Three times a day (TID) | INTRAMUSCULAR | Status: DC | PRN

## 2014-05-26 MED ORDER — POTASSIUM CHLORIDE IN NACL 20-0.9 MEQ/L-% IV SOLN
INTRAVENOUS | Status: DC
  Administered 2014-05-26: 20:00:00 via INTRAVENOUS

## 2014-05-26 MED ORDER — LORAZEPAM 2 MG/ML IJ SOLN
0.5000 mg | Freq: Two times a day (BID) | INTRAMUSCULAR | Status: DC | PRN
  Administered 2014-05-26 – 2014-05-28 (×2): 0.5 mg via INTRAVENOUS
  Filled 2014-05-26 (×2): qty 1

## 2014-05-26 MED ORDER — ACETAMINOPHEN 650 MG RE SUPP: 650.0000 mg | Freq: Four times a day (QID) | RECTAL | Status: DC | PRN

## 2014-05-26 MED ORDER — CETYLPYRIDINIUM CHLORIDE 0.05 % MT LIQD
7.0000 mL | Freq: Two times a day (BID) | OROMUCOSAL | Status: DC
  Administered 2014-05-27 – 2014-05-30 (×5): 7 mL via OROMUCOSAL

## 2014-05-26 MED ORDER — ALBUTEROL SULFATE (2.5 MG/3ML) 0.083% IN NEBU: 2.5000 mg | INHALATION_SOLUTION | Freq: Once | RESPIRATORY_TRACT | Status: DC

## 2014-05-26 MED ORDER — NOREPINEPHRINE BITARTRATE 1 MG/ML IV SOLN
0.0000 ug/min | INTRAVENOUS | Status: DC
  Administered 2014-05-26: 5 ug/min via INTRAVENOUS
  Filled 2014-05-26: qty 4

## 2014-05-26 MED ORDER — PIPERACILLIN-TAZOBACTAM 3.375 G IVPB
3.3750 g | Freq: Three times a day (TID) | INTRAVENOUS | Status: DC
Start: 2014-05-26 — End: 2014-05-30
  Administered 2014-05-26 – 2014-05-30 (×13): 3.375 g via INTRAVENOUS
  Filled 2014-05-26 (×19): qty 50

## 2014-05-26 MED ORDER — VANCOMYCIN HCL 10 G IV SOLR
1500.0000 mg | Freq: Two times a day (BID) | INTRAVENOUS | Status: DC
  Administered 2014-05-26 – 2014-05-28 (×5): 1500 mg via INTRAVENOUS
  Filled 2014-05-26 (×5): qty 1500

## 2014-05-26 MED ORDER — IOHEXOL 350 MG/ML SOLN: 150.0000 mL | Freq: Once | INTRAVENOUS | Status: AC | PRN

## 2014-05-26 MED ORDER — ONDANSETRON HCL 4 MG PO TABS: 4.0000 mg | ORAL_TABLET | Freq: Four times a day (QID) | ORAL | Status: DC | PRN

## 2014-05-26 MED ORDER — LORAZEPAM 2 MG/ML IJ SOLN
0.5000 mg | Freq: Once | INTRAMUSCULAR | Status: AC
  Administered 2014-05-26: 0.5 mg via INTRAVENOUS
  Filled 2014-05-26: qty 1

## 2014-05-26 NOTE — Plan of Care (Signed)
Problem: Consults Goal: Diabetes Guidelines if Diabetic/Glucose > 140 If diabetic or lab glucose is > 140 mg/dl - Initiate Diabetes/Hyperglycemia Guidelines & Document Interventions  Outcome: Completed/Met Date Met:  05/26/14  Problem: Phase I Progression Outcomes Goal: Pain controlled with appropriate interventions Outcome: Completed/Met Date Met:  05/26/14

## 2014-05-26 NOTE — Progress Notes (Signed)
ANTIBIOTIC CONSULT NOTE - FOLLOW UP  Pharmacy Consult for Vancomycin and Zosyn  Indication: rule out sepsis / bacteremia / diabetic foot ulcers  No Known Allergies  Patient Measurements: Height: 6' (182.9 cm) Weight: (!) 386 lb 7.5 oz (175.3 kg) IBW/kg (Calculated) : 77.6  Vital Signs: Temp: 101.6 F (38.7 C) (12/08 0622) Temp Source: Core (Comment) (12/08 0400) BP: 113/48 mmHg (12/08 0645) Pulse Rate: 87 (12/08 0645) Intake/Output from previous day: 12/07 0701 - 12/08 0700 In: 515.8 [I.V.:315.8; IV Piggyback:200] Out: 1300 [Urine:1300] Intake/Output from this shift:   Labs:  Recent Labs  05/25/14 2111 05/25/14 2135 05/26/14 0250  WBC 15.5*  --  12.3*  HGB 14.2  --  12.8*  PLT 170  --  151  CREATININE  --  1.24 1.12   Estimated Creatinine Clearance: 108.5 mL/min (by C-G formula based on Cr of 1.12). No results for input(s): VANCOTROUGH, VANCOPEAK, VANCORANDOM, GENTTROUGH, GENTPEAK, GENTRANDOM, TOBRATROUGH, TOBRAPEAK, TOBRARND, AMIKACINPEAK, AMIKACINTROU, AMIKACIN in the last 72 hours.   Microbiology: Recent Results (from the past 720 hour(s))  MRSA PCR Screening     Status: None   Collection Time: 05/26/14  2:00 AM  Result Value Ref Range Status   MRSA by PCR NEGATIVE NEGATIVE Final    Comment:        The GeneXpert MRSA Assay (FDA approved for NASAL specimens only), is one component of a comprehensive MRSA colonization surveillance program. It is not intended to diagnose MRSA infection nor to guide or monitor treatment for MRSA infections.    Anti-infectives    Start     Dose/Rate Route Frequency Ordered Stop   05/26/14 1000  vancomycin (VANCOCIN) IVPB 1000 mg/200 mL premix     1,000 mg200 mL/hr over 60 Minutes Intravenous Every 12 hours 05/26/14 0255     05/26/14 0800  piperacillin-tazobactam (ZOSYN) IVPB 3.375 g     3.375 g12.5 mL/hr over 240 Minutes Intravenous Every 8 hours 05/26/14 0255     05/26/14 0300  vancomycin (VANCOCIN) IVPB 1000 mg/200 mL  premix     1,000 mg200 mL/hr over 60 Minutes Intravenous  Once 05/26/14 0252 05/26/14 0425   05/26/14 0000  piperacillin-tazobactam (ZOSYN) IVPB 3.375 g     3.375 g100 mL/hr over 30 Minutes Intravenous  Once 05/25/14 2351 05/26/14 0056   05/25/14 2145  vancomycin (VANCOCIN) IVPB 1000 mg/200 mL premix     1,000 mg200 mL/hr over 60 Minutes Intravenous  Once 05/25/14 2141 05/25/14 2300     Assessment: Okay for Protocol , obese male being treated for sepsis, Hx DM w/ current foot ulcers.  Initial regimen started last evening. Obesity/Normalized CrCl dosing protocol will be initiate with an estimated normalized CrCl = 67 ml/min.   Goal of Therapy:  Vancomycin trough level 15-20 mcg/ml  Plan:  Zosyn 3.375gm IV every 8 hours. Follow-up micro data, labs, vitals.  Vancomycin 1500mg  IV every 12 hours. Measure antibiotic drug levels at steady state  Pricilla Larsson 05/26/2014,8:06 AM

## 2014-05-26 NOTE — Care Management Utilization Note (Signed)
UR complete 

## 2014-05-26 NOTE — Progress Notes (Addendum)
Patient seen and examined. Admitted earlier today with fevers/chills and septic shock from an unknown source: CXR/UA negative. Blood cx pending. After 5 L of IVF remained hypotensive and was started on a levophed drip that has since been weaned off. He became SOB around 5 am and was placed on Bipap for comfort and CT chest was ordered to r/o PE. By the time I see him, he is on [place], satting well and in no distress. Will cancel order for CT. Will order foot xray to examine bilateral 1st metatarsal ulcers and r/o osteomyelitis. He is improving. Continue vanc/zosyn and monitor 1 more night in the ICU.  Domingo Mend, MD [facility] Pager: [phone]

## 2014-05-26 NOTE — Progress Notes (Signed)
ANTIBIOTIC CONSULT NOTE - INITIAL  Pharmacy Consult for vancomycin & Zosyn Indication: R/O sepsis  No Known Allergies  Patient Measurements: Height: 6' (182.9 cm) Weight: (!) 386 lb 7.5 oz (175.3 kg) IBW/kg (Calculated) : 77.6 Adjusted Body Weight: 110 kg  Vital Signs: Temp: 99.7 F (37.6 C) (12/08 0200) Temp Source: Axillary (12/08 0200) BP: 90/54 mmHg (12/08 0100) Pulse Rate: 77 (12/08 0100) Intake/Output from previous day: 12/07 0701 - 12/08 0700 In: -  Out: 600 [Urine:600] Intake/Output from this shift: Total I/O In: -  Out: 600 [Urine:600]  Labs:  Recent Labs  05/25/14 2111 05/25/14 2135  WBC 15.5*  --   HGB 14.2  --   PLT 170  --   CREATININE  --  1.24   CrCl estimated to be 60-90 ml/min - for age and condition.  Patient with low blood pressure - presumed decreased renal blood flow and therefore decreased urine output.  Will need to watch I/O's to better assess CrCl, as well as SCr.   Microbiology: No results found for this or any previous visit (from the past 720 hour(s)).  Medical History: Past Medical History  Diagnosis Date  . DM (diabetes mellitus)   . Obesity   . Sleep apnea   . HTN (hypertension)     Medications:  Scheduled:  . enoxaparin (LOVENOX) injection  80 mg Subcutaneous Q24H  . lidocaine-EPINEPHrine      . piperacillin-tazobactam (ZOSYN)  IV  3.375 g Intravenous Q8H  . vancomycin  1,000 mg Intravenous Once  . vancomycin  1,000 mg Intravenous Q12H   Infusions:  . 0.9 % NaCl with KCl 20 mEq / L    . norepinephrine (LEVOPHED) Adult infusion 10 mcg/min (05/26/14 0102)   PRN: acetaminophen **OR** acetaminophen, ondansetron **OR** ondansetron (ZOFRAN) IV   Assessment: 63yr old large, obese DM male with probable sepsis, and decreased urine output with low blood pressure  Goal of Therapy:  Desire vancomycin serum trough level 15-21mcg/ml.  Will need another vancomycin dose now for a full loading dose.  Will utilize standard Zosyn  regimen with CrCl >33ml/min  Plan:  1.  Vancomycin 1gm IV now for total vancomycin 2gm loading dose, then 2.  Vancomycin 1gm IV q12h for obese male with CrCl presumed >51ml/min 3.  Zosyn 3.375gm IV q8h (4hr infusion) 4.  Will monitor for indices of infection and renal function 5.  Will measure actual steady state serum trough vancomycin level as clinically indicated  Yania Bogie E 05/26/2014,2:48 AM

## 2014-05-26 NOTE — H&P (Signed)
PCP:   Purvis Kilts, MD   Chief Complaint:  Rigors/chills  HPI: 63 yo male h/o dm, diabetic foot ulcers, htn comes in with one day of rigors /chills occurred earlier today that prompted him to come to the ED.  Says he felt awful.  Was in his usual state of health when this suddenly came on.  Denies any recent illnesses.  He has been fighting foot ulcers for many years, but has not seen a podiatrist about it for 2 years.  Wounds started developing about a year ago and progressivly gotten worse.  He has chronic lympedema to ble, this is actually better than it usually is.  His left foot is normally more swollen than his right and this is also no different than usual.  Denies any new redness or problems with his legs.  Denies any cough.  No n/v/d.  No chest pain or abd pain.  No dysuria.  No recent hospitalizations.  No resp symptoms.  He has received 5 liters ivf and tyenlol in the ED and says he feels much better.  On arrival he was hypotensive with a temp of 104.   Review of Systems:  Positive and negative as per HPI otherwise all other systems are negative  Past Medical History: Past Medical History  Diagnosis Date  . DM (diabetes mellitus)   . Obesity   . Sleep apnea   . HTN (hypertension)    History reviewed. No pertinent past surgical history.  Medications: Prior to Admission medications   Medication Sig Start Date End Date Taking? Authorizing Provider  aspirin EC 81 MG tablet Take 81 mg by mouth every evening.   Yes Historical Provider, MD  atorvastatin (LIPITOR) 40 MG tablet Take 40 mg by mouth daily.   Yes Historical Provider, MD  furosemide (LASIX) 20 MG tablet Take 20 mg by mouth 2 (two) times daily. *May take one additional tablet as needed for fluid retention   Yes Historical Provider, MD  gemfibrozil (LOPID) 600 MG tablet Take 600 mg by mouth 2 (two) times daily before a meal.   Yes Historical Provider, MD  lisinopril-hydrochlorothiazide (PRINZIDE,ZESTORETIC)  20-12.5 MG per tablet Take 1 tablet by mouth daily.   Yes Historical Provider, MD  metFORMIN (GLUCOPHAGE) 500 MG tablet Take 250 mg by mouth 2 (two) times daily with a meal.    Yes Historical Provider, MD  potassium chloride SA (K-DUR,KLOR-CON) 20 MEQ tablet Take 20 mEq by mouth 2 (two) times daily.   Yes Historical Provider, MD    Allergies:  No Known Allergies  Social History:  reports that he has never smoked. He does not have any smokeless tobacco history on file. He reports that he does not drink alcohol or use illicit drugs.  Family History: Family History  Problem Relation Age of Onset  . Heart disease Brother     Physical Exam: Filed Vitals:   05/26/14 0000 05/26/14 0030 05/26/14 0100 05/26/14 0102  BP: 96/53 84/49 90/54    Pulse: 87 83 77   Temp:    98.8 F (37.1 C)  TempSrc:    Oral  Resp: 18 17 19    Height:      Weight:      SpO2: 93% 94% 95%    General appearance: alert, cooperative and no distress Head: Normocephalic, without obvious abnormality, atraumatic Eyes: negative Nose: Nares normal. Septum midline. Mucosa normal. No drainage or sinus tenderness. Neck: no JVD and supple, symmetrical, trachea midline Lungs: clear to auscultation bilaterally Heart: regular  rate and rhythm, S1, S2 normal, no murmur, click, rub or gallop Abdomen: soft, non-tender; bowel sounds normal; no masses,  no organomegaly Extremities: extremities normal, atraumatic, no cyanosis or edema  bilateral diabetic wounds with no obvious redness or drainage, no foul smell.   Pulses: 2+ and symmetric Skin: Skin color, texture, turgor normal. No rashes or lesions Neurologic: Grossly normal    Labs on Admission:   Recent Labs  05/25/14 2135  NA 137  K 4.3  CL 97  CO2 23  GLUCOSE 153*  BUN 23  CREATININE 1.24  CALCIUM 9.4    Recent Labs  05/25/14 2135  AST 19  ALT 10  ALKPHOS 81  BILITOT 0.6  PROT 7.2  ALBUMIN 3.4*    Recent Labs  05/25/14 2111  WBC 15.5*  NEUTROABS  14.8*  HGB 14.2  HCT 42.5  MCV 96.6  PLT 170   Radiological Exams on Admission: Dg Chest Port 1 View  05/26/2014   CLINICAL DATA:  Central line placement. Line placed in the right side of the neck. Chills, weakness, and shortness of breath for 1 day.  EXAM: PORTABLE CHEST - 1 VIEW  COMPARISON:  05/25/2014  FINDINGS: Interval placement of right central venous catheter. Tip overlies the low SVC region. No pneumothorax. Shallow inspiration. Mild cardiac enlargement. No focal airspace disease or consolidation in the lungs. No blunting of costophrenic angles.  IMPRESSION: No active disease.   Electronically Signed   By: Lucienne Capers M.D.   On: 05/26/2014 00:43   Dg Chest Port 1 View  05/25/2014   CLINICAL DATA:  Chills, weakness, and shortness of breath for 1 day.  EXAM: PORTABLE CHEST - 1 VIEW  COMPARISON:  10/05/2006  FINDINGS: Shallow inspiration. The heart size and mediastinal contours are within normal limits. Both lungs are clear. The visualized skeletal structures are unremarkable.  IMPRESSION: No active disease.   Electronically Signed   By: Lucienne Capers M.D.   On: 05/25/2014 21:30    Assessment/Plan  63 yo male with septic shock from unclear source may be diabetic foot ulcers  Principal Problem:   Septic shock-  Source unclear but could be from foot ulcers.  Lactic acid level over 4 on arrival.  Has received 5 liters of ivf, and still hypotensive in ED, now on levophed gtt and MAP is above 65.  Repeat lactic acid level now.  Place on vanc/zosyn iv.  Urine and blood cultures done.  Really do not appreciate any celluliits to his feet, but both legs seem to be chronically in poor shape.  No ua actually yet, place foley and obtain sample.   Pt appears to be improving overall so far.  Active Problems:  Stable unless o/w noted   DM (diabetes mellitus)-  ssi   Obesity   Sleep apnea-     HTN (hypertension)   Cellulitis of foot, left  Admit to icu bed due to levophed gtt and moderate  septic shock.  mentating normally.  Full code.    Gerad Cornelio A 05/26/2014, 1:12 AM

## 2014-05-27 ENCOUNTER — Inpatient Hospital Stay (HOSPITAL_COMMUNITY): Payer: Medicare Other

## 2014-05-27 DIAGNOSIS — L97509 Non-pressure chronic ulcer of other part of unspecified foot with unspecified severity: Secondary | ICD-10-CM

## 2014-05-27 DIAGNOSIS — J9601 Acute respiratory failure with hypoxia: Secondary | ICD-10-CM | POA: Diagnosis present

## 2014-05-27 DIAGNOSIS — K089 Disorder of teeth and supporting structures, unspecified: Secondary | ICD-10-CM | POA: Diagnosis present

## 2014-05-27 DIAGNOSIS — E11621 Type 2 diabetes mellitus with foot ulcer: Secondary | ICD-10-CM | POA: Diagnosis present

## 2014-05-27 DIAGNOSIS — R7881 Bacteremia: Secondary | ICD-10-CM | POA: Diagnosis present

## 2014-05-27 DIAGNOSIS — R000 Tachycardia, unspecified: Secondary | ICD-10-CM | POA: Diagnosis present

## 2014-05-27 HISTORY — DX: Type 2 diabetes mellitus with foot ulcer: E11.621

## 2014-05-27 HISTORY — DX: Bacteremia: R78.81

## 2014-05-27 LAB — GLUCOSE, CAPILLARY
GLUCOSE-CAPILLARY: 144 mg/dL — AB (ref 70–99)
GLUCOSE-CAPILLARY: 137 mg/dL — AB (ref 70–99)
Glucose-Capillary: 122 mg/dL — ABNORMAL HIGH (ref 70–99)
Glucose-Capillary: 146 mg/dL — ABNORMAL HIGH (ref 70–99)

## 2014-05-27 LAB — CBC
HCT: 40.8 % (ref 39.0–52.0)
Hemoglobin: 13.3 g/dL (ref 13.0–17.0)
MCH: 32.4 pg (ref 26.0–34.0)
MCHC: 32.6 g/dL (ref 30.0–36.0)
MCV: 99.3 fL (ref 78.0–100.0)
Platelets: 125 10*3/uL — ABNORMAL LOW (ref 150–400)
RBC: 4.11 MIL/uL — ABNORMAL LOW (ref 4.22–5.81)
RDW: 14.5 % (ref 11.5–15.5)
WBC: 6.4 10*3/uL (ref 4.0–10.5)

## 2014-05-27 LAB — BASIC METABOLIC PANEL
Anion gap: 9 (ref 5–15)
BUN: 12 mg/dL (ref 6–23)
CALCIUM: 8.3 mg/dL — AB (ref 8.4–10.5)
CO2: 26 mEq/L (ref 19–32)
Chloride: 104 mEq/L (ref 96–112)
Creatinine, Ser: 0.9 mg/dL (ref 0.50–1.35)
GFR calc Af Amer: >90 mL/min (ref >90)
GFR calc non Af Amer: 87 mL/min — ABNORMAL LOW (ref >90)
GLUCOSE: 123 mg/dL — AB (ref 70–99)
Potassium: 4.4 mEq/L (ref 3.7–5.3)
Sodium: 139 mEq/L (ref 137–147)

## 2014-05-27 LAB — URINE CULTURE
Colony Count: NO GROWTH
Culture: NO GROWTH

## 2014-05-27 MED ORDER — SODIUM CHLORIDE 0.9 % IV SOLN
INTRAVENOUS | Status: DC
  Administered 2014-05-27 – 2014-06-01 (×4): via INTRAVENOUS

## 2014-05-27 MED ORDER — LISINOPRIL 5 MG PO TABS
5.0000 mg | ORAL_TABLET | Freq: Every day | ORAL | Status: DC
  Administered 2014-05-27 – 2014-06-02 (×7): 5 mg via ORAL
  Filled 2014-05-27 (×7): qty 1

## 2014-05-27 MED ORDER — BISACODYL 10 MG RE SUPP: 10.0000 mg | Freq: Every day | RECTAL | Status: DC | PRN

## 2014-05-27 MED ORDER — POLYETHYLENE GLYCOL 3350 17 G PO PACK
17.0000 g | PACK | Freq: Every day | ORAL | Status: DC
  Administered 2014-05-27 – 2014-06-01 (×6): 17 g via ORAL
  Filled 2014-05-27 (×7): qty 1

## 2014-05-27 NOTE — Consult Note (Signed)
WOC follow-up: Podiatry in earlier; refer to progress notes for assessment and topical treatment and refer to their team for further questions. Please re-consult if further assistance is needed.  Thank-you,  Julien Girt MSN, [place], [place], [place], [place]

## 2014-05-27 NOTE — Clinical Documentation Improvement (Signed)
Presents with sepsis, septic shock, cellulitis of left foot.   Patient became SOB  Respiratory rate ranging from 19 to 26; tachycardic 109  Treated with Bipap and 4L FiO2  Please provide a diagnosis associated with the above clinical indicators and document findings in next progress note and include in discharge summary if clinically significant.    Acute Respiratory Failure Acute on Chronic Respiratory Failure Chronic Respiratory Failure Other Condition   Thank You, Zoila Shutter ,RN Clinical Documentation Specialist:  [place] Information Management

## 2014-05-27 NOTE — Consult Note (Signed)
WOC wound consult note Reason for Consult: Consult requested for BLE. Performed via remote camera with bedside nurse assistance to measure and assess wounds. Pt states foot wounds have been present for about a year.  He used to be followed by a podiatrist, but has not gone in several years. This week, his legs developed increased edema and leakage. X-ray does not indicate osteomylitis to either wound; MRI results are pending. Wound type: Bilat calf areas with generalized edema and erythremia.  Left leg larger in size. Pt has been started on IV antibiotics for cellulitis and currently there no open wounds or drainage to leg areas.  Left plantar foot with full thickness wound; 5X5cm dry callous surrounding open wound, which is 2.3X2.3X.5cm 100% dry eschar which is tightly adhered without fluctuance, odor, or drainage. Right plantar foot with full thickness wound; .5cm dry callous surrounding open wound, which is 1X1X1cm,  80% moist eschar, 20% yellow slough, some strong odor, mod amt yellow drainage. Dressing procedure/placement/frequency: Podiatry consult pending, according to EMR. Will defer to this team for further plan of care.  Pt could benefit from [id number] for chemical debridement of nonviable tissue to bilat foot wounds, and continued follow-up with podiatrist after discharge for sharp debridement after tissue loosens. Please order if desired. Moist gauze dressings have been ordered until further feedback is available. Please re-consult if further assistance is needed.  Thank-you,  Julien Girt MSN, [place], [place], [place], [place]

## 2014-05-27 NOTE — Plan of Care (Signed)
Problem: Phase I Progression Outcomes Goal: OOB as tolerated unless otherwise ordered Outcome: Not Progressing Pt is weak and has bilateral diabetic foot ulcers Goal: Initial discharge plan identified Outcome: Progressing Goal: Hemodynamically stable Outcome: Progressing

## 2014-05-27 NOTE — Consult Note (Signed)
Podiatry Consult Note  Reason for Consultation:  Ulcerations of both feet.  History of Present Illness: Thomas Carey is a 63 year old male who presented to the emergency room on 05/25/2014 complaining of weakness and chills.  Upon admission he related that he had suffered with ulcerations of both feet for an extended duration.  The current ulcerations have been present for about 1 year.  He is previously been followed by Dr. Berline Lopes but has been lost to follow-up.  He also relates he suffers from lymphedema.  He denies any pain at the site of the ulcerations.   Past Medical History  Diagnosis Date  . DM (diabetes mellitus)   . Obesity   . Sleep apnea   . HTN (hypertension)    Scheduled Meds: . antiseptic oral rinse  7 mL Mouth Rinse q12n4p  . chlorhexidine  15 mL Mouth Rinse BID  . enoxaparin (LOVENOX) injection  80 mg Subcutaneous Q24H  . lisinopril  5 mg Oral Daily  . piperacillin-tazobactam (ZOSYN)  IV  3.375 g Intravenous Q8H  . polyethylene glycol  17 g Oral Daily  . vancomycin  1,500 mg Intravenous Q12H   Continuous Infusions: . sodium chloride 65 mL/hr at 05/27/14 1100   PRN Meds:.acetaminophen **OR** acetaminophen, bisacodyl, LORazepam, ondansetron **OR** ondansetron (ZOFRAN) IV  No Known Allergies History reviewed. No pertinent past surgical history. Family History  Problem Relation Age of Onset  . Heart disease Brother    Social History:  reports that he has never smoked. He does not have any smokeless tobacco history on file. He reports that he does not drink alcohol or use illicit drugs.  Review of Systems: Edema of both feet, ulcerations of both feet.  Physical Examination: Vital signs in last 24 hours:   Temp:  [99.7 F (37.6 C)-100.3 F (37.9 C)] 100.3 F (37.9 C) (12/09 1151) Pulse Rate:  [47-118] 116 (12/09 1100) Resp:  [18-27] 21 (12/09 1100) BP: (95-153)/(53-89) 112/72 mmHg (12/09 1100) SpO2:  [92 %-100 %] 95 % (12/09 1100) Weight:  [405 lb 10.3 oz  (184 kg)] 405 lb 10.3 oz (184 kg) (12/09 0600)  Left foot:  Skin is warm.  Skin of the lower leg is indurated.  There is a hemorrhagic hyperkeratotic lesion present along the plantar aspect of the first metatarsal head.  Following debridement of the hyperkeratotic tissue, a full-thickness ulceration was encountered measuring 1.5 cm in length by 1.0 cm in width by 1.0 cm in depth.  The ulceration did not probe to bone.  There is some malodor.  There is no active drainage present.  Dorsalis pedis pulse is palpable.  I wasn't able to palpate a posterior tibial pulse secondary to edema.  There is pitting edema of the lower extremity.  Light touch sensation is diminished.  Right foot:  Skin is warm.  Skin of the lower leg is indurated.  There is a full-thickness ulceration present along the plantar aspect of the first metatarsal head with a mixed fibrotic and granular wound bed.  There is surrounding hemorrhagic hyperkeratotic tissue present.  Following debridement of the hyperkeratotic tissue, the full-thickness ulceration measured 1.4 cm in length by 0.8 cm in width by 0.6 cm in depth.  The ulceration did not probe to bone.  There is some malodor.  There is no active drainage present.  Dorsalis pedis pulse is palpable.  I wasn't able to palpate a posterior tibial pulse secondary to edema.  There is pitting edema of the lower extremity.  Light touch sensation is  diminished.    Lab/Test Results:   Recent Labs  05/26/14 0250 05/27/14 0520  WBC 12.3* 6.4  HGB 12.8* 13.3  HCT 38.4* 40.8  PLT 151 125*  NA 141 139  K 4.0 4.4  CL 104 104  CO2 24 26  BUN 21 12  CREATININE 1.12 0.90  GLUCOSE 141* 123*  CALCIUM 8.0* 8.3*    Recent Results (from the past 240 hour(s))  Culture, blood (routine x 2)     Status: None (Preliminary result)   Collection Time: 05/25/14  9:35 PM  Result Value Ref Range Status   Specimen Description BLOOD LEFT HAND  Final   Special Requests BOTTLES DRAWN AEROBIC AND ANAEROBIC  6CC  Final   Culture   Final    GRAM POSITIVE COCCI IN PAIRS AND CHAINS Gram Stain Report Called to,Read Back By and Verified With: LEE B AT [place] ON [id number] BY FORSYTH K Performed at [facility]    Report Status PENDING  Incomplete  Culture, blood (routine x 2)     Status: None (Preliminary result)   Collection Time: 05/25/14  9:35 PM  Result Value Ref Range Status   Specimen Description BLOOD LEFT HAND  Final   Special Requests BOTTLES DRAWN AEROBIC AND ANAEROBIC 6CC  Final   Culture NO GROWTH 2 DAYS  Final   Report Status PENDING  Incomplete  MRSA PCR Screening     Status: None   Collection Time: 05/26/14  2:00 AM  Result Value Ref Range Status   MRSA by PCR NEGATIVE NEGATIVE Final    Comment:        The GeneXpert MRSA Assay (FDA approved for NASAL specimens only), is one component of a comprehensive MRSA colonization surveillance program. It is not intended to diagnose MRSA infection nor to guide or monitor treatment for MRSA infections.      Dg Chest Port 1 View  05/27/2014   CLINICAL DATA:  Fever and chills.  EXAM: PORTABLE CHEST - 1 VIEW  COMPARISON:  05/26/2014.  FINDINGS: Right IJ line in good anatomic position. Cardiomegaly with normal pulmonary vascularity. Poor inspiration basilar atelectasis. Developing infiltrate left lung base cannot be excluded. No pleural effusion or pneumothorax.  IMPRESSION: 1. Cardiomegaly.  Pulmonary vascularity normal. 2. Low lung volumes with basilar atelectasis. Developing mild infiltrate left lung base cannot be excluded.   Electronically Signed   By: Marcello Moores  Register   On: 05/27/2014 07:01   Dg Chest Port 1 View  05/26/2014   CLINICAL DATA:  Fever, chills, difficulty breathing.  EXAM: PORTABLE CHEST - 1 VIEW  COMPARISON:  05/26/2014  FINDINGS: No change in of location of central venous catheter. Shallow inspiration. Cardiac enlargement. Pulmonary vascularity is probably normal for technique. No focal consolidation in the lungs. No  blunting of costophrenic angles. No pneumothorax.  IMPRESSION: Shallow inspiration. Cardiac enlargement. No evidence of active pulmonary disease.   Electronically Signed   By: Lucienne Capers M.D.   On: 05/26/2014 05:52   Dg Chest Port 1 View  05/26/2014   CLINICAL DATA:  Central line placement. Line placed in the right side of the neck. Chills, weakness, and shortness of breath for 1 day.  EXAM: PORTABLE CHEST - 1 VIEW  COMPARISON:  05/25/2014  FINDINGS: Interval placement of right central venous catheter. Tip overlies the low SVC region. No pneumothorax. Shallow inspiration. Mild cardiac enlargement. No focal airspace disease or consolidation in the lungs. No blunting of costophrenic angles.  IMPRESSION: No active disease.   Electronically  Signed   By: Lucienne Capers M.D.   On: 05/26/2014 00:43   Dg Chest Port 1 View  05/25/2014   CLINICAL DATA:  Chills, weakness, and shortness of breath for 1 day.  EXAM: PORTABLE CHEST - 1 VIEW  COMPARISON:  10/05/2006  FINDINGS: Shallow inspiration. The heart size and mediastinal contours are within normal limits. Both lungs are clear. The visualized skeletal structures are unremarkable.  IMPRESSION: No active disease.   Electronically Signed   By: Lucienne Capers M.D.   On: 05/25/2014 21:30   Dg Foot 2 Views Left  05/26/2014   CLINICAL DATA:  Swelling, redness, question osteomyelitis, staph infection  EXAM: LEFT FOOT - 2 VIEW  COMPARISON:  None.  FINDINGS: Two views of the left foot submitted. No acute fracture or subluxation. Significant soft tissue swelling dorsal foot and around the ankle. Plantar spurring of calcaneus. No periosteal reaction or bony erosion to suggest osteomyelitis.  IMPRESSION: No acute fracture or subluxation. No definite evidence of osteomyelitis. Significant soft tissue swelling. Plantar spurring of calcaneus pre   Electronically Signed   By: Lahoma Crocker M.D.   On: 05/26/2014 11:44   Dg Foot 2 Views Right  05/26/2014   CLINICAL DATA:   Swelling, redness, diabetes, osteomyelitis, checking for staph infection  EXAM: RIGHT FOOT - 2 VIEW  COMPARISON:  04/19/2010  FINDINGS: Of the right foot submitted. No acute fracture or subluxation. No bone destruction to suggest osteomyelitis. There is plantar spur of calcaneus. Soft tissue swelling noted dorsal metatarsal region and anterior plantar aspect. There is soft tissue ulceration anterior plantar spurring.  IMPRESSION: No acute fracture or subluxation. No definite evidence of osteomyelitis. Plantar spurring of calcaneus. Soft tissue swelling dorsal metatarsal region and anterior plantar region. There is soft tissue ulceration anterior plantar region.   Electronically Signed   By: Lahoma Crocker M.D.   On: 05/26/2014 11:46    Assessment: 1.  Cellulitis. 2.  Diabetic ulceration of the left foot. 3.  Diabetic ulceration of the right foot. 4.  Diabetes mellitus with peripheral neuropathy. 5.  Lymphedema, both lower extremitites.    Plan: The podiatric pathology and treatment options were explained to him.  Debridement at bedside was recommended.  Informed consent was obtained.  An excisional full-thickness debridement of the ulcerations of both feet were performed at bedside using a #15 blade.  Hyperkeratotic tissue and subcutaneous tissue was removed.  Hemostasis was achieved with compression.  A wet to dry dressing was applied to both feet.  Unable to perform MRI.  Given the clinical and radiographic findings, my suspicion for osteomyelitis is low.  I agree with antibiotic choice.  Based on the appearance of the wounds tomorrow, I will consider transitioning him to Aquacel dressings.  I will continue to monitor him throughout his admission.  Thank you for this consultation allowing me to participate in the care of Thomas Carey.  Cassaundra Rasch IVAN 05/27/2014, 11:55 AM

## 2014-05-27 NOTE — Progress Notes (Addendum)
[facility] PROGRESS NOTE  Thomas Carey VZD:[id number] DOB: 09/17/1950 DOA: 05/25/2014 PCP: Purvis Kilts, MD    Code Status: Full code Family Communication: Discussed with patient; family not available Disposition Plan: Discharge when clinically appropriate.   Consultants:  Podiatry pending  Procedures:  Right IJ central line (in the ED).  Antibiotics:  Zosyn 12/8>>  Vancomycin 12/8>>  HPI/Subjective: Patient has no complaints of chest pain or shortness of breath, although he was questioned about being short of breath. He complains of constipation. He has no complaints of bilateral leg pain.  Objective: Filed Vitals:   05/27/14 0756  BP:   Pulse:   Temp: 100.1 F (37.8 C)  Resp:    heart rate 118. Respiratory rate 21. Blood pressure 153/79. Weight 405 pounds.  Intake/Output Summary (Last 24 hours) at 05/27/14 2951 Last data filed at 05/27/14 0500  Gross per 24 hour  Intake 3021.25 ml  Output   2251 ml  Net 770.25 ml   Filed Weights   05/25/14 2304 05/26/14 0200 05/27/14 0600  Weight: 167.831 kg (370 lb) 175.3 kg (386 lb 7.5 oz) 184 kg (405 lb 10.3 oz)    Exam:   General:  Morbidly obese 63 year old man laying in bed, in no acute distress.  Oropharynx with very poor dentition, multiple caries and cavities and periodontal disease.  Cardiovascular: S1, S2, with tachycardia.  Respiratory: Mildly tachypnea when speaking; lungs clear anteriorly with decreased breath sounds in the bases; no specific audible wheezes or crackles.  Abdomen: Positive bowel sounds, morbidly obese, nontender, nondistended.  Musculoskeletal/extremities: 2+ to 3+ pitting edema left leg and 2+ pitting edema of right leg. Mild-moderate erythema of both legs from below the knees to the ankles. Bilateral ulcers of plantar metatarsal area with mild surrounding erythema; surrounding callus; no obvious drainage or purulence. Pedal pulses not palpable, in part, due to  edema.  Data Reviewed: Basic Metabolic Panel:  Recent Labs Lab 05/25/14 2135 05/26/14 0250 05/27/14 0520  NA 137 141 139  K 4.3 4.0 4.4  CL 97 104 104  CO2 23 24 26   GLUCOSE 153* 141* 123*  BUN 23 21 12   CREATININE 1.24 1.12 0.90  CALCIUM 9.4 8.0* 8.3*   Liver Function Tests:  Recent Labs Lab 05/25/14 2135  AST 19  ALT 10  ALKPHOS 81  BILITOT 0.6  PROT 7.2  ALBUMIN 3.4*   No results for input(s): LIPASE, AMYLASE in the last 168 hours. No results for input(s): AMMONIA in the last 168 hours. CBC:  Recent Labs Lab 05/25/14 2111 05/26/14 0250 05/27/14 0520  WBC 15.5* 12.3* 6.4  NEUTROABS 14.8*  --   --   HGB 14.2 12.8* 13.3  HCT 42.5 38.4* 40.8  MCV 96.6 97.2 99.3  PLT 170 151 125*   Cardiac Enzymes:  Recent Labs Lab 05/26/14 0628  TROPONINI <0.30   BNP (last 3 results)  Recent Labs  05/26/14 0628  PROBNP 881.7*   CBG:  Recent Labs Lab 05/26/14 0726 05/26/14 1116 05/26/14 1612 05/26/14 2105 05/27/14 0742  GLUCAP 115* 154* 109* 98 122*    Recent Results (from the past 240 hour(s))  Culture, blood (routine x 2)     Status: None (Preliminary result)   Collection Time: 05/25/14  9:35 PM  Result Value Ref Range Status   Specimen Description BLOOD LEFT HAND  Final   Special Requests BOTTLES DRAWN AEROBIC AND ANAEROBIC 6CC  Final   Culture   Final    GRAM POSITIVE COCCI IN PAIRS  AND CHAINS Gram Stain Report Called to,Read Back By and Verified With: LEE B AT 0334 ON [id number] BY FORSYTH K    Report Status PENDING  Incomplete  Culture, blood (routine x 2)     Status: None (Preliminary result)   Collection Time: 05/25/14  9:35 PM  Result Value Ref Range Status   Specimen Description BLOOD LEFT HAND  Final   Special Requests BOTTLES DRAWN AEROBIC AND ANAEROBIC 6CC  Final   Culture NO GROWTH 1 DAY  Final   Report Status PENDING  Incomplete  MRSA PCR Screening     Status: None   Collection Time: 05/26/14  2:00 AM  Result Value Ref Range Status    MRSA by PCR NEGATIVE NEGATIVE Final    Comment:        The GeneXpert MRSA Assay (FDA approved for NASAL specimens only), is one component of a comprehensive MRSA colonization surveillance program. It is not intended to diagnose MRSA infection nor to guide or monitor treatment for MRSA infections.      Studies: Dg Chest Port 1 View  05/27/2014   CLINICAL DATA:  Fever and chills.  EXAM: PORTABLE CHEST - 1 VIEW  COMPARISON:  05/26/2014.  FINDINGS: Right IJ line in good anatomic position. Cardiomegaly with normal pulmonary vascularity. Poor inspiration basilar atelectasis. Developing infiltrate left lung base cannot be excluded. No pleural effusion or pneumothorax.  IMPRESSION: 1. Cardiomegaly.  Pulmonary vascularity normal. 2. Low lung volumes with basilar atelectasis. Developing mild infiltrate left lung base cannot be excluded.   Electronically Signed   By: Marcello Moores  Register   On: 05/27/2014 07:01   Dg Chest Port 1 View  05/26/2014   CLINICAL DATA:  Fever, chills, difficulty breathing.  EXAM: PORTABLE CHEST - 1 VIEW  COMPARISON:  05/26/2014  FINDINGS: No change in of location of central venous catheter. Shallow inspiration. Cardiac enlargement. Pulmonary vascularity is probably normal for technique. No focal consolidation in the lungs. No blunting of costophrenic angles. No pneumothorax.  IMPRESSION: Shallow inspiration. Cardiac enlargement. No evidence of active pulmonary disease.   Electronically Signed   By: Lucienne Capers M.D.   On: 05/26/2014 05:52   Dg Chest Port 1 View  05/26/2014   CLINICAL DATA:  Central line placement. Line placed in the right side of the neck. Chills, weakness, and shortness of breath for 1 day.  EXAM: PORTABLE CHEST - 1 VIEW  COMPARISON:  05/25/2014  FINDINGS: Interval placement of right central venous catheter. Tip overlies the low SVC region. No pneumothorax. Shallow inspiration. Mild cardiac enlargement. No focal airspace disease or consolidation in the lungs.  No blunting of costophrenic angles.  IMPRESSION: No active disease.   Electronically Signed   By: Lucienne Capers M.D.   On: 05/26/2014 00:43   Dg Chest Port 1 View  05/25/2014   CLINICAL DATA:  Chills, weakness, and shortness of breath for 1 day.  EXAM: PORTABLE CHEST - 1 VIEW  COMPARISON:  10/05/2006  FINDINGS: Shallow inspiration. The heart size and mediastinal contours are within normal limits. Both lungs are clear. The visualized skeletal structures are unremarkable.  IMPRESSION: No active disease.   Electronically Signed   By: Lucienne Capers M.D.   On: 05/25/2014 21:30   Dg Foot 2 Views Left  05/26/2014   CLINICAL DATA:  Swelling, redness, question osteomyelitis, staph infection  EXAM: LEFT FOOT - 2 VIEW  COMPARISON:  None.  FINDINGS: Two views of the left foot submitted. No acute fracture or subluxation. Significant soft tissue  swelling dorsal foot and around the ankle. Plantar spurring of calcaneus. No periosteal reaction or bony erosion to suggest osteomyelitis.  IMPRESSION: No acute fracture or subluxation. No definite evidence of osteomyelitis. Significant soft tissue swelling. Plantar spurring of calcaneus pre   Electronically Signed   By: Lahoma Crocker M.D.   On: 05/26/2014 11:44   Dg Foot 2 Views Right  05/26/2014   CLINICAL DATA:  Swelling, redness, diabetes, osteomyelitis, checking for staph infection  EXAM: RIGHT FOOT - 2 VIEW  COMPARISON:  04/19/2010  FINDINGS: Of the right foot submitted. No acute fracture or subluxation. No bone destruction to suggest osteomyelitis. There is plantar spur of calcaneus. Soft tissue swelling noted dorsal metatarsal region and anterior plantar aspect. There is soft tissue ulceration anterior plantar spurring.  IMPRESSION: No acute fracture or subluxation. No definite evidence of osteomyelitis. Plantar spurring of calcaneus. Soft tissue swelling dorsal metatarsal region and anterior plantar region. There is soft tissue ulceration anterior plantar region.    Electronically Signed   By: Lahoma Crocker M.D.   On: 05/26/2014 11:46    Scheduled Meds: . antiseptic oral rinse  7 mL Mouth Rinse q12n4p  . chlorhexidine  15 mL Mouth Rinse BID  . enoxaparin (LOVENOX) injection  80 mg Subcutaneous Q24H  . piperacillin-tazobactam (ZOSYN)  IV  3.375 g Intravenous Q8H  . vancomycin  1,500 mg Intravenous Q12H   Continuous Infusions: . norepinephrine (LEVOPHED) Adult infusion Stopped (05/26/14 0800)   Assessment and plan:  Principal Problem:   Septic shock Active Problems:   Bilateral cellulitis of lower leg   Diabetic foot ulcers   Acute respiratory failure with hypoxia   Gram-positive cocci bacteremia   DM (diabetes mellitus), type 2 with peripheral vascular complications   Obesity   Sleep apnea   Sinus tachycardia   1. Septic shock with gram-positive bacteremia.  Etiology appears to be cellulitis and/or bilateral foot ulcers, although they do not appear to be that impressive. Status post Levophed and more than 5 L of IV fluid resuscitation. His blood pressure is better and Levophed was titrated off on 12/8. One out of 2 blood cultures positive for gram-positive cocci. Identification pending. Influenza panel negative. Follow-up UA with no significant WBCs or bacteria. We'll continue vancomycin and Zosyn.  Bilateral cellulitis of lower legs with bilateral lower extremity edema left greater than right. We'll continue antibiotics as above. Will order bilateral lower extremity venous ultrasound to rule out DVT.  Bilateral plantar metatarsal ulcers. Plain x-rays revealed no obvious osteoporosis. However given his history of diabetes, will order MRI of the left plantar surface initially to rule out osteomyelitis. His body habitus may be an issue with the scanning, so we'll try the left foot only today. Podiatry consult pending. Continue antibiotics.  Sinus tachycardia. This can be secondary to fever and/or underlying infection. Doubt ongoing  dehydration/volume depletion, but will continue gentle IV fluids. Will order an EKG and TSH/free T4.  Hypertension. His blood pressure is better. We'll restart this in a pool, but we'll continue to hold hydrochlorothiazide.  Obstructive sleep apnea. We'll continue BiPAP/CPAP as tolerated.  Acute respiratory failure with hypoxia. Not sure what his underlying baseline oxygen status is, but his P O2 has been consistently in the mid 60s on room air. It improves with nasal cannula oxygen. Continue oxygen supplementation.  Diabetes mellitus, type 2. Will order hemoglobin A1c and sliding scale NovoLog.  Poor dentition.  The patient needs to see a dentist or oral surgeon given his multiple cavities in  the outpatient setting.  Thrombocytopenia. The patient's platelet count was within normal limits on admission, but has decreased. This could be secondary to hemodilution from volume resuscitation and/or antibiotics and/or infection. We'll continue to monitor. He is on Lovenox and SCDs for DVT prophylaxis and bilateral lower extremity edema. We'll recheck his platelet count again tomorrow and if it continues to decrease precipitously, we'll consider discontinuing Lovenox.    Time spent: 45 minutes.    [facility] Pager [phone]. If 7PM-7AM, please contact night-coverage at [email or web address], password [facility] 05/27/2014, 8:32 AM  LOS: 2 days

## 2014-05-27 NOTE — Care Management Note (Addendum)
    Page 1 of 1   06/02/2014     1:54:27 PM CARE MANAGEMENT NOTE 06/02/2014  Patient:  Thomas Carey, Thomas Carey   Account Number:  [id number]  Date Initiated:  05/27/2014  Documentation initiated by:  Jolene Provost  Subjective/Objective Assessment:   Pt is from home.     Action/Plan:   Pt plans to return home at discharge. ? [facility] RN for wound care.  Will continue to follow.   Anticipated DC Date:  05/30/2014   Anticipated DC Plan:  [place]  CM consult      Choice offered to / List presented to:             Status of service:  Completed, signed off Medicare Important Message given?  YES (If response is "NO", the following Medicare IM given date fields will be blank) Date Medicare IM given:  05/29/2014 Medicare IM given by:  Vladimir Creeks Date Additional Medicare IM given:  06/02/2014 Additional Medicare IM given by:  Theophilus Kinds  Discharge Disposition:  [place]  Per UR Regulation:    If discussed at Long Length of Stay Meetings, dates discussed:   06/02/2014    Comments:  06/02/14 [place], RN BSN CM Pt discharged today to [facility]. CSW to arrange discharge to facility.  05/29/2014 [place], RN, MSN, PCCN Will plan for discharge with [facility] RN for wound care. Emma of [facility], per pt's choice, has been notifed of referral and pt's RN will fax orders and notify [facility] of discharge if pt discharged over weekend.  05/27/2014 [place], RN, MSN, [employer]

## 2014-05-28 DIAGNOSIS — L03115 Cellulitis of right lower limb: Secondary | ICD-10-CM

## 2014-05-28 DIAGNOSIS — E1159 Type 2 diabetes mellitus with other circulatory complications: Secondary | ICD-10-CM

## 2014-05-28 LAB — HEMOGLOBIN A1C
Hgb A1c MFr Bld: 6.3 % — ABNORMAL HIGH (ref <5.7)
Mean Plasma Glucose: 134 mg/dL — ABNORMAL HIGH (ref <117)

## 2014-05-28 LAB — CBC
HCT: 37.6 % — ABNORMAL LOW (ref 39.0–52.0)
Hemoglobin: 12 g/dL — ABNORMAL LOW (ref 13.0–17.0)
MCH: 31.9 pg (ref 26.0–34.0)
MCHC: 31.9 g/dL (ref 30.0–36.0)
MCV: 100 fL (ref 78.0–100.0)
PLATELETS: 134 10*3/uL — AB (ref 150–400)
RBC: 3.76 MIL/uL — ABNORMAL LOW (ref 4.22–5.81)
RDW: 14.4 % (ref 11.5–15.5)
WBC: 5.8 10*3/uL (ref 4.0–10.5)

## 2014-05-28 LAB — BASIC METABOLIC PANEL
Anion gap: 9 (ref 5–15)
BUN: 11 mg/dL (ref 6–23)
CALCIUM: 8.4 mg/dL (ref 8.4–10.5)
CO2: 28 mEq/L (ref 19–32)
CREATININE: 0.9 mg/dL (ref 0.50–1.35)
Chloride: 104 mEq/L (ref 96–112)
GFR calc Af Amer: >90 mL/min (ref >90)
GFR, EST NON AFRICAN AMERICAN: 87 mL/min — AB (ref >90)
GLUCOSE: 128 mg/dL — AB (ref 70–99)
Potassium: 4.4 mEq/L (ref 3.7–5.3)
Sodium: 141 mEq/L (ref 137–147)

## 2014-05-28 LAB — VANCOMYCIN, TROUGH: Vancomycin Tr: 12.5 ug/mL (ref 10.0–20.0)

## 2014-05-28 LAB — TSH: TSH: 1.6 u[IU]/mL (ref 0.350–4.500)

## 2014-05-28 LAB — T4, FREE: Free T4: 0.81 ng/dL (ref 0.80–1.80)

## 2014-05-28 LAB — GLUCOSE, CAPILLARY
GLUCOSE-CAPILLARY: 106 mg/dL — AB (ref 70–99)
GLUCOSE-CAPILLARY: 127 mg/dL — AB (ref 70–99)
Glucose-Capillary: 123 mg/dL — ABNORMAL HIGH (ref 70–99)
Glucose-Capillary: 119 mg/dL — ABNORMAL HIGH (ref 70–99)

## 2014-05-28 MED ORDER — HYDROCHLOROTHIAZIDE 12.5 MG PO CAPS
12.5000 mg | ORAL_CAPSULE | Freq: Every day | ORAL | Status: DC
  Administered 2014-05-28 – 2014-05-30 (×3): 12.5 mg via ORAL
  Filled 2014-05-28 (×3): qty 1

## 2014-05-28 MED ORDER — ALBUTEROL SULFATE (2.5 MG/3ML) 0.083% IN NEBU: 2.5000 mg | INHALATION_SOLUTION | Freq: Four times a day (QID) | RESPIRATORY_TRACT | Status: DC

## 2014-05-28 MED ORDER — LEVALBUTEROL HCL 0.63 MG/3ML IN NEBU
0.6300 mg | INHALATION_SOLUTION | Freq: Three times a day (TID) | RESPIRATORY_TRACT | Status: DC
Start: 2014-05-28 — End: 2014-06-02
  Administered 2014-05-28 – 2014-06-02 (×15): 0.63 mg via RESPIRATORY_TRACT
  Filled 2014-05-28 (×16): qty 3

## 2014-05-28 MED ORDER — VANCOMYCIN HCL 10 G IV SOLR
1750.0000 mg | Freq: Two times a day (BID) | INTRAVENOUS | Status: DC
  Administered 2014-05-28 – 2014-06-01 (×8): 1750 mg via INTRAVENOUS
  Filled 2014-05-28 (×8): qty 1750

## 2014-05-28 NOTE — Progress Notes (Signed)
[facility] PROGRESS NOTE  Thomas Carey NTZ:[id number] DOB: [birth year] DOA: 05/25/2014 PCP: Purvis Kilts, MD    Code Status: Full code Family Communication: Discussed with patient; family not available Disposition Plan: Discharge when clinically appropriate.   Consultants:  Podiatry  , Dr. Caprice Beaver  Procedures:  Right IJ central line (in the ED).  Antibiotics:  Zosyn 12/8>>  Vancomycin 12/8>>  HPI/Subjective: Patient has no complaints of chest pain or shortness of breath. No complaints of bilateral foot pain. He had a large BM yesterday.  Objective: Filed Vitals:   05/28/14 0800  BP: 112/64  Pulse: 70  Temp:   Resp: 23    MAXIMUM TEMPERATURE 100.3. T current 98.8. Oxygen saturation 90% on room air.. Weight 413  Intake/Output Summary (Last 24 hours) at 05/28/14 0926 Last data filed at 05/28/14 6759  Gross per 24 hour  Intake 2586.5 ml  Output   1650 ml  Net  936.5 ml   Filed Weights   05/26/14 0200 05/27/14 0600 05/28/14 0500  Weight: 175.3 kg (386 lb 7.5 oz) 184 kg (405 lb 10.3 oz) 187.7 kg (413 lb 12.9 oz)    Exam:   General:  Morbidly obese 63 year old man in no acute distress.  Oropharynx with very poor dentition, multiple caries and cavities and periodontal disease.  Cardiovascular: S1, S2, with tachycardia and occasional ectopy  Respiratory:  Lungs with few faint expiratory wheezes in the upper lobes.  Abdomen: Positive bowel sounds, morbidly obese, nontender, nondistended.  Musculoskeletal/extremities: 2+ bilateral lower extremity pitting edema.  Decrease in erythema of both legs from below the knees to the ankles.  Bandages on both feet, not taken off (will examine when podiatry reexamined ). Pedal pulses not palpable, in part, due to edema and bandages.  Data Reviewed: Basic Metabolic Panel:  Recent Labs Lab 05/25/14 2135 05/26/14 0250 05/27/14 0520 05/28/14 0416  NA 137 141 139 141  K 4.3 4.0 4.4 4.4  CL 97 104 104  104  CO2 23 24 26 28   GLUCOSE 153* 141* 123* 128*  BUN 23 21 12 11   CREATININE 1.24 1.12 0.90 0.90  CALCIUM 9.4 8.0* 8.3* 8.4   Liver Function Tests:  Recent Labs Lab 05/25/14 2135  AST 19  ALT 10  ALKPHOS 81  BILITOT 0.6  PROT 7.2  ALBUMIN 3.4*   No results for input(s): LIPASE, AMYLASE in the last 168 hours. No results for input(s): AMMONIA in the last 168 hours. CBC:  Recent Labs Lab 05/25/14 2111 05/26/14 0250 05/27/14 0520 05/28/14 0416  WBC 15.5* 12.3* 6.4 5.8  NEUTROABS 14.8*  --   --   --   HGB 14.2 12.8* 13.3 12.0*  HCT 42.5 38.4* 40.8 37.6*  MCV 96.6 97.2 99.3 100.0  PLT 170 151 125* 134*   Cardiac Enzymes:  Recent Labs Lab 05/26/14 0628  TROPONINI <0.30   BNP (last 3 results)  Recent Labs  05/26/14 0628  PROBNP 881.7*   CBG:  Recent Labs Lab 05/27/14 0742 05/27/14 1134 05/27/14 1628 05/27/14 2059 05/28/14 0721  GLUCAP 122* 144* 137* 146* 106*    Recent Results (from the past 240 hour(s))  Culture, blood (routine x 2)     Status: None (Preliminary result)   Collection Time: 05/25/14  9:35 PM  Result Value Ref Range Status   Specimen Description BLOOD LEFT HAND  Final   Special Requests BOTTLES DRAWN AEROBIC AND ANAEROBIC [facility]  Final   Culture  Setup Time   Final    05/27/2014  15:03 Performed at [place] IN PAIRS AND CHAINS Note: Gram Stain Report Called to,Read Back By and Verified With: LEE B AT 7672 05/27/14 BY FORSYTH K Performed at [facility] Performed at [facility]    Report Status PENDING  Incomplete  Culture, blood (routine x 2)     Status: None (Preliminary result)   Collection Time: 05/25/14  9:35 PM  Result Value Ref Range Status   Specimen Description BLOOD LEFT HAND  Final   Special Requests BOTTLES DRAWN AEROBIC AND ANAEROBIC 6CC  Final   Culture NO GROWTH 2 DAYS  Final   Report Status PENDING  Incomplete  MRSA PCR Screening     Status:  None   Collection Time: 05/26/14  2:00 AM  Result Value Ref Range Status   MRSA by PCR NEGATIVE NEGATIVE Final    Comment:        The GeneXpert MRSA Assay (FDA approved for NASAL specimens only), is one component of a comprehensive MRSA colonization surveillance program. It is not intended to diagnose MRSA infection nor to guide or monitor treatment for MRSA infections.   Urine culture     Status: None   Collection Time: 05/26/14  3:18 AM  Result Value Ref Range Status   Specimen Description URINE, CLEAN CATCH  Final   Special Requests NONE  Final   Culture  Setup Time   Final    05/26/2014 21:57 Performed at [place] Performed at [facility]   Final   Culture NO GROWTH Performed at [facility]   Final   Report Status 05/27/2014 FINAL  Final     Studies: US Venous Img Lower Bilateral  05/27/2014   CLINICAL DATA:  Edema in both lower extremities with cellulitis. Patient complains of pain. Color changes and ulcerations.  EXAM: BILATERAL LOWER EXTREMITY VENOUS DOPPLER ULTRASOUND  TECHNIQUE: Gray-scale sonography with graded compression, as well as color Doppler and duplex ultrasound were performed to evaluate the lower extremity deep venous systems from the level of the common femoral vein and including the common femoral, femoral, profunda femoral, popliteal and calf veins including the posterior tibial, peroneal and gastrocnemius veins when visible. The superficial great saphenous vein was also interrogated. Spectral Doppler was utilized to evaluate flow at rest and with distal augmentation maneuvers in the common femoral, femoral and popliteal veins.  COMPARISON:  None.  FINDINGS: RIGHT LOWER EXTREMITY  Normal compressibility, augmentation and color Doppler flow in the right common femoral vein, right femoral vein and right popliteal vein. Limited evaluation of the deep calf veins. The distal great saphenous vein is  compressible and patent.  LEFT LOWER EXTREMITY  Normal compressibility, augmentation and color Doppler flow in the left common femoral vein, left femoral vein and left popliteal vein. Limited evaluation of the left deep calf veins. The distal left great saphenous vein is patent.  Other Findings: There is soft tissue edema, particularly in the left calf.  IMPRESSION: No evidence of deep venous thrombosis.   Electronically Signed   By: Markus Daft M.D.   On: 05/27/2014 16:56   Dg Chest Port 1 View  05/27/2014   CLINICAL DATA:  Fever and chills.  EXAM: PORTABLE CHEST - 1 VIEW  COMPARISON:  05/26/2014.  FINDINGS: Right IJ line in good anatomic position. Cardiomegaly with normal pulmonary vascularity. Poor inspiration basilar atelectasis. Developing infiltrate left  lung base cannot be excluded. No pleural effusion or pneumothorax.  IMPRESSION: 1. Cardiomegaly.  Pulmonary vascularity normal. 2. Low lung volumes with basilar atelectasis. Developing mild infiltrate left lung base cannot be excluded.   Electronically Signed   By: Marcello Moores  Register   On: 05/27/2014 07:01   Dg Foot 2 Views Left  05/26/2014   CLINICAL DATA:  Swelling, redness, question osteomyelitis, staph infection  EXAM: LEFT FOOT - 2 VIEW  COMPARISON:  None.  FINDINGS: Two views of the left foot submitted. No acute fracture or subluxation. Significant soft tissue swelling dorsal foot and around the ankle. Plantar spurring of calcaneus. No periosteal reaction or bony erosion to suggest osteomyelitis.  IMPRESSION: No acute fracture or subluxation. No definite evidence of osteomyelitis. Significant soft tissue swelling. Plantar spurring of calcaneus pre   Electronically Signed   By: Lahoma Crocker M.D.   On: 05/26/2014 11:44   Dg Foot 2 Views Right  05/26/2014   CLINICAL DATA:  Swelling, redness, diabetes, osteomyelitis, checking for staph infection  EXAM: RIGHT FOOT - 2 VIEW  COMPARISON:  04/19/2010  FINDINGS: Of the right foot submitted. No acute fracture  or subluxation. No bone destruction to suggest osteomyelitis. There is plantar spur of calcaneus. Soft tissue swelling noted dorsal metatarsal region and anterior plantar aspect. There is soft tissue ulceration anterior plantar spurring.  IMPRESSION: No acute fracture or subluxation. No definite evidence of osteomyelitis. Plantar spurring of calcaneus. Soft tissue swelling dorsal metatarsal region and anterior plantar region. There is soft tissue ulceration anterior plantar region.   Electronically Signed   By: Lahoma Crocker M.D.   On: 05/26/2014 11:46    Scheduled Meds: . antiseptic oral rinse  7 mL Mouth Rinse q12n4p  . chlorhexidine  15 mL Mouth Rinse BID  . enoxaparin (LOVENOX) injection  80 mg Subcutaneous Q24H  . lisinopril  5 mg Oral Daily  . piperacillin-tazobactam (ZOSYN)  IV  3.375 g Intravenous Q8H  . polyethylene glycol  17 g Oral Daily  . vancomycin  1,500 mg Intravenous Q12H   Continuous Infusions: . sodium chloride 65 mL/hr at 05/28/14 0800   Assessment and plan:  Principal Problem:   Septic shock Active Problems:   Bilateral cellulitis of lower leg   Diabetic foot ulcers   Acute respiratory failure with hypoxia   Gram-positive cocci bacteremia   DM (diabetes mellitus), type 2 with peripheral vascular complications   Obesity   Sleep apnea   Sinus tachycardia   Poor dentition   1.Septic shock with gram-positive bacteremia.  Etiology appears to be cellulitis and/or bilateral foot ulcers.  Status post Levophed and more than 5 L of IV fluid resuscitation. His blood pressure is better and Levophed was titrated off on 12/8.  One out of 2 blood cultures positive for gram-positive cocci. Identification pending. Influenza panel negative. Follow-up UA with no significant WBCs or bacteria.  Fever curve is improving. We'll continue vancomycin and Zosyn.  Bilateral cellulitis of lower legs with bilateral lower extremity edema left greater than right. Clinical improvement with  less erythema and edema of both legs. Bilateral lower extremity venous Dopplers were negative for DVT. We'll continue antibiotics as above.   Bilateral plantar metatarsal ulcers. Plain x-rays revealed no obvious osteoporosis. MRI  Of his left foot was ordered to rule out osteomyelitis, but the patient could not fit in the MRI scanner.  Podiatrist Dr. Caprice Beaver was consulted and opined that  His suspicion for osteomyelitis was low. He did a bedside debridement of both  of the plantar ulcers on  12/9. We'll continue to monitor. Continue antibiotics and wound care recommendations per Dr. Caprice Beaver.  Sinus tachycardia query atrial fibrillation with RVR. His heart rate has improved and the elevation man been secondary to fever and mild volume depletion.  EKG on 12 9 suggest atrial fibrillation. We'll restart aspirin and order 2-D echocardiogram. TSH/free T4  pending.  Hypertension. His blood pressure is better. Continue lisiniopril and  restart  hydrochlorothiazide.  Obstructive sleep apnea. We'll continue BiPAP/CPAP as tolerated.  Acute respiratory failure with hypoxia. Not sure what his underlying baseline oxygen status is, but his PO2 has been consistently in the mid 60s on room air. It improves with nasal cannula oxygen. Continue oxygen supplementation. Query obesity hypoventilation syndrome. Because of the mild wheezes, will order Xopenex nebulizer.  Diabetes mellitus, type 2. Will order hemoglobin A1c and sliding scale NovoLog.  Poor dentition.  The patient needs to see a dentist or oral surgeon given his multiple cavities in the outpatient setting.  Thrombocytopenia. The patient's platelet count was within normal limits on admission, but has decreased. This could be secondary to hemodilution from volume resuscitation and/or antibiotics and/or infection. We'll continue to monitor. He is on Lovenox and SCDs for DVT prophylaxis and bilateral lower extremity edema. Platelet count has  improved compared to yesterday. We'll continue to monitor.  We'll ask nursing to tried to insert a good peripheral IV so that right IJ central line can be discontinued.   Time spent: 35 minutes.    [facility] Pager [phone]. If 7PM-7AM, please contact night-coverage at [email or web address], password [facility] 05/28/2014, 9:26 AM  LOS: 3 days

## 2014-05-28 NOTE — Progress Notes (Signed)
ANTIBIOTIC CONSULT NOTE  Pharmacy Consult for Vancomycin and Zosyn  Indication: bacteremia / diabetic foot ulcers  No Known Allergies  Patient Measurements: Height: 6' (182.9 cm) Weight: (!) 413 lb 12.9 oz (187.7 kg) IBW/kg (Calculated) : 77.6  Vital Signs: Temp: 98.8 F (37.1 C) (12/10 0723) Temp Source: Oral (12/10 0723) BP: 131/67 mmHg (12/10 1001) Pulse Rate: 63 (12/10 1000) Intake/Output from previous day: 12/09 0701 - 12/10 0700 In: 2471.5 [I.V.:1371.5; IV Piggyback:1100] Out: 1884 [Urine:1650] Intake/Output from this shift: Total I/O In: 355 [P.O.:240; I.V.:65; IV Piggyback:50] Out: 350 [Urine:350] Labs:  Recent Labs  05/26/14 0250 05/27/14 0520 05/28/14 0416  WBC 12.3* 6.4 5.8  HGB 12.8* 13.3 12.0*  PLT 151 125* 134*  CREATININE 1.12 0.90 0.90   Estimated Creatinine Clearance: 140.7 mL/min (by C-G formula based on Cr of 0.9).  Recent Labs  05/28/14 0855  VANCOTROUGH 12.5     Microbiology: Recent Results (from the past 720 hour(s))  Culture, blood (routine x 2)     Status: None (Preliminary result)   Collection Time: 05/25/14  9:35 PM  Result Value Ref Range Status   Specimen Description BLOOD LEFT HAND  Final   Special Requests BOTTLES DRAWN AEROBIC AND ANAEROBIC [facility]  Final   Culture  Setup Time   Final    05/27/2014 15:03 Performed at [facility]    Culture   Final    [place] IN PAIRS AND CHAINS Note: Gram Stain Report Called to,Read Back By and Verified With: LEE B AT 1660 05/27/14 BY FORSYTH K Performed at [facility] Performed at [facility]    Report Status PENDING  Incomplete  Culture, blood (routine x 2)     Status: None (Preliminary result)   Collection Time: 05/25/14  9:35 PM  Result Value Ref Range Status   Specimen Description BLOOD LEFT HAND  Final   Special Requests BOTTLES DRAWN AEROBIC AND ANAEROBIC 6CC  Final   Culture NO GROWTH 3 DAYS  Final   Report Status PENDING  Incomplete  MRSA PCR  Screening     Status: None   Collection Time: 05/26/14  2:00 AM  Result Value Ref Range Status   MRSA by PCR NEGATIVE NEGATIVE Final    Comment:        The GeneXpert MRSA Assay (FDA approved for NASAL specimens only), is one component of a comprehensive MRSA colonization surveillance program. It is not intended to diagnose MRSA infection nor to guide or monitor treatment for MRSA infections.   Urine culture     Status: None   Collection Time: 05/26/14  3:18 AM  Result Value Ref Range Status   Specimen Description URINE, CLEAN CATCH  Final   Special Requests NONE  Final   Culture  Setup Time   Final    05/26/2014 21:57 Performed at [place] Performed at [facility]   Final   Culture NO GROWTH Performed at [facility]   Final   Report Status 05/27/2014 FINAL  Final   Anti-infectives    Start     Dose/Rate Route Frequency Ordered Stop   05/26/14 1000  vancomycin (VANCOCIN) IVPB 1000 mg/200 mL premix  Status:  Discontinued     1,000 mg200 mL/hr over 60 Minutes Intravenous Every 12 hours 05/26/14 0255 05/26/14 0812   05/26/14 1000  vancomycin (VANCOCIN) 1,500 mg in sodium chloride 0.9 % 500 mL IVPB     1,500 mg250  mL/hr over 120 Minutes Intravenous Every 12 hours 05/26/14 0812     05/26/14 0800  piperacillin-tazobactam (ZOSYN) IVPB 3.375 g     3.375 g12.5 mL/hr over 240 Minutes Intravenous Every 8 hours 05/26/14 0255     05/26/14 0300  vancomycin (VANCOCIN) IVPB 1000 mg/200 mL premix     1,000 mg200 mL/hr over 60 Minutes Intravenous  Once 05/26/14 0252 05/26/14 0425   05/26/14 0000  piperacillin-tazobactam (ZOSYN) IVPB 3.375 g     3.375 g100 mL/hr over 30 Minutes Intravenous  Once 05/25/14 2351 05/26/14 0056   05/25/14 2145  vancomycin (VANCOCIN) IVPB 1000 mg/200 mL premix     1,000 mg200 mL/hr over 60 Minutes Intravenous  Once 05/25/14 2141 05/25/14 2300     Assessment: 63 yo obese male being treated for sepsis.  Hx  DM w/ bilateral foot ulcers.  No evidence of osteomyelitis per xray.  1/2 Blood cx + GPC. Renal function improved to patient's baseline.  Normalized CrCl ~80-24ml/min.    Vancomycin trough slightly below goal range today.   Vancomycin 12/7>> Zosyn 12/8<<  Goal of Therapy:  Vancomycin trough level 15-20 mcg/ml  Plan:  Continue Zosyn 3.375gm IV every 8 hours. Increase Vancomycin 1750mg  IV every 12 hours. Weekly Vancomycin trough while on Vancomycin Monitor renal function and cx data  Duration of therapy per MD  Biagio Borg 05/28/2014,10:56 AM

## 2014-05-28 NOTE — Progress Notes (Signed)
Around 1615 today, patient became tearful and expressed negative feelings about self. Pt stated that he felt horrible that he had let himself get into the shape he's in and feels that it's his fault that he is so overweight. He stated that he felt that everyone "ran away" from him because he falls asleep all the time and that he's so big. He also stated that he has "very horrible nightmares" almost every time he falls asleep. He says he has been experiencing these for some time. Emotional support and encouragement given to pt; will continue to monitor.

## 2014-05-28 NOTE — Plan of Care (Signed)
Problem: Phase I Progression Outcomes Goal: Voiding-avoid urinary catheter unless indicated Outcome: Completed/Met Date Met:  05/28/14 Foley discontinued today; pt has voided since Goal: Hemodynamically stable Outcome: Progressing  Problem: Phase III Progression Outcomes Goal: Pain controlled on oral analgesia Outcome: Completed/Met Date Met:  05/28/14 Goal: Voiding independently Outcome: Completed/Met Date Met:  05/28/14 Goal: Foley discontinued Outcome: Completed/Met Date Met:  05/28/14

## 2014-05-29 DIAGNOSIS — I517 Cardiomegaly: Secondary | ICD-10-CM

## 2014-05-29 LAB — GLUCOSE, CAPILLARY
GLUCOSE-CAPILLARY: 95 mg/dL (ref 70–99)
GLUCOSE-CAPILLARY: 107 mg/dL — AB (ref 70–99)
Glucose-Capillary: 120 mg/dL — ABNORMAL HIGH (ref 70–99)
Glucose-Capillary: 104 mg/dL — ABNORMAL HIGH (ref 70–99)

## 2014-05-29 LAB — CULTURE, BLOOD (ROUTINE X 2)

## 2014-05-29 MED ORDER — FUROSEMIDE 20 MG PO TABS
20.0000 mg | ORAL_TABLET | Freq: Every day | ORAL | Status: DC
  Administered 2014-05-30: 20 mg via ORAL
  Filled 2014-05-29: qty 1

## 2014-05-29 MED ORDER — FUROSEMIDE 20 MG PO TABS: 20.0000 mg | ORAL_TABLET | Freq: Every day | ORAL | Status: DC

## 2014-05-29 MED ORDER — FUROSEMIDE 10 MG/ML IJ SOLN
20.0000 mg | Freq: Once | INTRAMUSCULAR | Status: AC
  Administered 2014-05-29: 20 mg via INTRAVENOUS
  Filled 2014-05-29: qty 2

## 2014-05-29 MED ORDER — POTASSIUM CHLORIDE CRYS ER 20 MEQ PO TBCR
20.0000 meq | EXTENDED_RELEASE_TABLET | Freq: Two times a day (BID) | ORAL | Status: DC
  Administered 2014-05-29 – 2014-05-30 (×3): 20 meq via ORAL
  Filled 2014-05-29 (×3): qty 1

## 2014-05-29 MED ORDER — ATORVASTATIN CALCIUM 40 MG PO TABS
40.0000 mg | ORAL_TABLET | Freq: Every day | ORAL | Status: DC
  Administered 2014-05-29 – 2014-06-01 (×4): 40 mg via ORAL
  Filled 2014-05-29 (×4): qty 1

## 2014-05-29 NOTE — Care Management Utilization Note (Signed)
UR complete 

## 2014-05-29 NOTE — Progress Notes (Addendum)
Podiatry Progress Note  Subjective: [name] is a 63 year old male who is being followed for ulcerations of both feet.  Dr. Caryn Section present for today's visit.   Past Medical History  Diagnosis Date  . DM (diabetes mellitus)   . Obesity   . Sleep apnea   . HTN (hypertension)    Scheduled Meds: . antiseptic oral rinse  7 mL Mouth Rinse q12n4p  . atorvastatin  40 mg Oral q1800  . chlorhexidine  15 mL Mouth Rinse BID  . enoxaparin (LOVENOX) injection  80 mg Subcutaneous Q24H  . [START ON 05/30/2014] furosemide  20 mg Oral Daily  . hydrochlorothiazide  12.5 mg Oral Daily  . levalbuterol  0.63 mg Nebulization Q8H  . lisinopril  5 mg Oral Daily  . piperacillin-tazobactam (ZOSYN)  IV  3.375 g Intravenous Q8H  . polyethylene glycol  17 g Oral Daily  . potassium chloride  20 mEq Oral BID  . vancomycin  1,750 mg Intravenous Q12H   Continuous Infusions: . sodium chloride 30 mL/hr at 05/29/14 0150   PRN Meds:.acetaminophen **OR** acetaminophen, bisacodyl, LORazepam, ondansetron **OR** ondansetron (ZOFRAN) IV  No Known Allergies History reviewed. No pertinent past surgical history. Family History  Problem Relation Age of Onset  . Heart disease Brother    Social History:  reports that he has never smoked. He does not have any smokeless tobacco history on file. He reports that he does not drink alcohol or use illicit drugs.  Review of Systems: Edema of both feet, ulcerations of both feet.  Physical Examination: Vital signs in last 24 hours:   Temp:  [98.6 F (37 C)-99 F (37.2 C)] 98.6 F (37 C) (12/11 0400) Pulse Rate:  [33-68] 58 (12/11 1000) Resp:  [12-25] 18 (12/11 1000) BP: (114-134)/(55-74) 131/70 mmHg (12/11 1000) SpO2:  [92 %-98 %] 95 % (12/11 1000) FiO2 (%):  [35 %] 35 % (12/11 0505) Weight:  [408 lb 15.3 oz (185.5 kg)] 408 lb 15.3 oz (185.5 kg) (12/11 0500)  Left foot:  Dressing is in place.  Skin is warm.  Skin of the lower leg is indurated.  There is a full  thickness ulceration present along the plantar aspect of the first metatarsal head.  Ulceration appears improved.  Dorsalis pedis pulse is palpable.  I wasn't able to palpate a posterior tibial pulse secondary to edema.  There is pitting edema of the lower extremity.  The edema is improved.  Light touch sensation is diminished.  Right foot:  Dressing is in place.  Skin is warm.  Skin of the lower leg is indurated.  There is a full thickness ulceration present along the plantar aspect of the first metatarsal head.  Ulceration appears improved.  Dorsalis pedis pulse is palpable.  I wasn't able to palpate a posterior tibial pulse secondary to edema.  There is pitting edema of the lower extremity.  The edema is improved.  Light touch sensation is diminished.   Lab/Test Results:    Recent Labs  05/27/14 0520 05/28/14 0416  WBC 6.4 5.8  HGB 13.3 12.0*  HCT 40.8 37.6*  PLT 125* 134*  NA 139 141  K 4.4 4.4  CL 104 104  CO2 26 28  BUN 12 11  CREATININE 0.90 0.90  GLUCOSE 123* 128*  CALCIUM 8.3* 8.4    Recent Results (from the past 240 hour(s))  Culture, blood (routine x 2)     Status: None   Collection Time: 05/25/14  9:35 PM  Result Value Ref Range  Status   Specimen Description BLOOD LEFT HAND  Final   Special Requests BOTTLES DRAWN AEROBIC AND ANAEROBIC [facility]  Final   Culture  Setup Time   Final    05/27/2014 15:03 Performed at [facility]    Culture   Final    VIRIDANS STREPTOCOCCUS Note: Gram Stain Report Called to,Read Back By and Verified With: LEE B AT 1740 05/27/14 BY FORSYTH K Performed at [facility] Performed at [facility]    Report Status 05/29/2014 FINAL  Final  Culture, blood (routine x 2)     Status: None (Preliminary result)   Collection Time: 05/25/14  9:35 PM  Result Value Ref Range Status   Specimen Description BLOOD LEFT HAND  Final   Special Requests BOTTLES DRAWN AEROBIC AND ANAEROBIC 6CC  Final   Culture NO GROWTH 3 DAYS  Final    Report Status PENDING  Incomplete  MRSA PCR Screening     Status: None   Collection Time: 05/26/14  2:00 AM  Result Value Ref Range Status   MRSA by PCR NEGATIVE NEGATIVE Final    Comment:        The GeneXpert MRSA Assay (FDA approved for NASAL specimens only), is one component of a comprehensive MRSA colonization surveillance program. It is not intended to diagnose MRSA infection nor to guide or monitor treatment for MRSA infections.   Urine culture     Status: None   Collection Time: 05/26/14  3:18 AM  Result Value Ref Range Status   Specimen Description URINE, CLEAN CATCH  Final   Special Requests NONE  Final   Culture  Setup Time   Final    05/26/2014 21:57 Performed at [place] Performed at [facility]   Final   Culture NO GROWTH Performed at [facility]   Final   Report Status 05/27/2014 FINAL  Final     US Venous Img Lower Bilateral  05/27/2014   CLINICAL DATA:  Edema in both lower extremities with cellulitis. Patient complains of pain. Color changes and ulcerations.  EXAM: BILATERAL LOWER EXTREMITY VENOUS DOPPLER ULTRASOUND  TECHNIQUE: Gray-scale sonography with graded compression, as well as color Doppler and duplex ultrasound were performed to evaluate the lower extremity deep venous systems from the level of the common femoral vein and including the common femoral, femoral, profunda femoral, popliteal and calf veins including the posterior tibial, peroneal and gastrocnemius veins when visible. The superficial great saphenous vein was also interrogated. Spectral Doppler was utilized to evaluate flow at rest and with distal augmentation maneuvers in the common femoral, femoral and popliteal veins.  COMPARISON:  None.  FINDINGS: RIGHT LOWER EXTREMITY  Normal compressibility, augmentation and color Doppler flow in the right common femoral vein, right femoral vein and right popliteal vein. Limited evaluation of the deep  calf veins. The distal great saphenous vein is compressible and patent.  LEFT LOWER EXTREMITY  Normal compressibility, augmentation and color Doppler flow in the left common femoral vein, left femoral vein and left popliteal vein. Limited evaluation of the left deep calf veins. The distal left great saphenous vein is patent.  Other Findings: There is soft tissue edema, particularly in the left calf.  IMPRESSION: No evidence of deep venous thrombosis.   Electronically Signed   By: Markus Daft M.D.   On: 05/27/2014 16:56    Assessment: 1.  Cellulitis. 2.  Diabetic ulceration of the left foot. 3.  Diabetic ulceration  of the right foot. 4.  Diabetes mellitus with peripheral neuropathy. 5.  Lymphedema, both lower extremitites.    Plan: An Aquacel dressing was applied to both feet.  The dressing is to be changed tomorrow.  I agree with current antibiotics.  I recommend PWB on both lower extremities with postoperative shoe and use of walker.  I will see him again on Sunday.  Thomas Carey IVAN 05/29/2014, 12:42 PM

## 2014-05-29 NOTE — Progress Notes (Signed)
Report called to L.Covington,RN. Patient transferred to department 300 in stable condition via bed with NT.

## 2014-05-29 NOTE — Progress Notes (Signed)
Spoke with reception at Dr. Arvil Persons office. Will be in to see patient around lunchtime. Asked to have aquacel available.

## 2014-05-29 NOTE — Progress Notes (Signed)
*  PRELIMINARY RESULTS* Echocardiogram 2D Echocardiogram has been performed.  Thomas Carey 05/29/2014, 3:52 PM

## 2014-05-29 NOTE — Progress Notes (Signed)
[facility] PROGRESS NOTE  Thomas Carey VCB:[id number] DOB: 04-May-1951 DOA: 05/25/2014 PCP: Purvis Kilts, MD    Code Statu also well s: Full code Family Communication: Discussed with patient; family not available Disposition Plan: Discharge when clinically appropriate.    Consultants:  Podiatry  , Dr. Caprice Beaver  Procedures:  Bedside debridement of plantar ulcers, Dr. Caprice Beaver, on 05/27/14  Right IJ central line (in the ED)-discontinued on 05/28/14.    Antibiotics:  Zosyn 12/8>>  Vancomycin 12/8>>  HPI/Subjective: Patient has no complaints of chest pain or shortness of breath. No complaints of bilateral foot pain.   Objective: Filed Vitals:   05/29/14 0900  BP: 130/65  Pulse: 62  Temp:   Resp: 16   temperature afebrile.  Oxygen saturation 96% on nasal cannula oxygen.  Weight 408 pounds.   Intake/Output Summary (Last 24 hours) at 05/29/14 0923 Last data filed at 05/29/14 3846  Gross per 24 hour  Intake 3133.67 ml  Output   1975 ml  Net 1158.67 ml   Filed Weights   05/27/14 0600 05/28/14 0500 05/29/14 0500  Weight: 184 kg (405 lb 10.3 oz) 187.7 kg (413 lb 12.9 oz) 185.5 kg (408 lb 15.3 oz)    Exam:   General:  Morbidly obese 63 year old man in no acute distress.  Cardiovascular: S1, S2, with bradycardia  and occasional ectopy-EKG on 05/29/14 reveals sinus rhythm with PVCs and heart rate of 62 bpm.   Respiratory:  Lungs with few faint expiratory wheezes in the upper lobes.  Abdomen: Positive bowel sounds, morbidly obese, nontender, nondistended.  Musculoskeletal/extremities:  1-2+ bilateral lower extremity pitting edema; decreased .  Decrease in erythema of both legs from below the knees to the ankles.  Bandages on both feet, not taken off (will examine when podiatry reexamined ). Pedal pulses not palpable, in part, due to edema and bandages.  Data Reviewed: Basic Metabolic Panel:  Recent Labs Lab 05/25/14 2135 05/26/14 0250  05/27/14 0520 05/28/14 0416  NA 137 141 139 141  K 4.3 4.0 4.4 4.4  CL 97 104 104 104  CO2 23 24 26 28   GLUCOSE 153* 141* 123* 128*  BUN 23 21 12 11   CREATININE 1.24 1.12 0.90 0.90  CALCIUM 9.4 8.0* 8.3* 8.4   Liver Function Tests:  Recent Labs Lab 05/25/14 2135  AST 19  ALT 10  ALKPHOS 81  BILITOT 0.6  PROT 7.2  ALBUMIN 3.4*   No results for input(s): LIPASE, AMYLASE in the last 168 hours. No results for input(s): AMMONIA in the last 168 hours. CBC:  Recent Labs Lab 05/25/14 2111 05/26/14 0250 05/27/14 0520 05/28/14 0416  WBC 15.5* 12.3* 6.4 5.8  NEUTROABS 14.8*  --   --   --   HGB 14.2 12.8* 13.3 12.0*  HCT 42.5 38.4* 40.8 37.6*  MCV 96.6 97.2 99.3 100.0  PLT 170 151 125* 134*   Cardiac Enzymes:  Recent Labs Lab 05/26/14 0628  TROPONINI <0.30   BNP (last 3 results)  Recent Labs  05/26/14 0628  PROBNP 881.7*   CBG:  Recent Labs Lab 05/28/14 0721 05/28/14 1137 05/28/14 1654 05/28/14 2113 05/29/14 0741  GLUCAP 106* 123* 127* 119* 95    Recent Results (from the past 240 hour(s))  Culture, blood (routine x 2)     Status: None (Preliminary result)   Collection Time: 05/25/14  9:35 PM  Result Value Ref Range Status   Specimen Description BLOOD LEFT HAND  Final   Special Requests BOTTLES DRAWN AEROBIC AND  ANAEROBIC [facility]  Final   Culture  Setup Time   Final    05/27/2014 15:03 Performed at [facility]    Culture   Final    [place] IN PAIRS AND CHAINS Note: Gram Stain Report Called to,Read Back By and Verified With: LEE B AT 7858 05/27/14 BY FORSYTH K Performed at [facility] Performed at [facility]    Report Status PENDING  Incomplete  Culture, blood (routine x 2)     Status: None (Preliminary result)   Collection Time: 05/25/14  9:35 PM  Result Value Ref Range Status   Specimen Description BLOOD LEFT HAND  Final   Special Requests BOTTLES DRAWN AEROBIC AND ANAEROBIC 6CC  Final   Culture NO GROWTH 3  DAYS  Final   Report Status PENDING  Incomplete  MRSA PCR Screening     Status: None   Collection Time: 05/26/14  2:00 AM  Result Value Ref Range Status   MRSA by PCR NEGATIVE NEGATIVE Final    Comment:        The GeneXpert MRSA Assay (FDA approved for NASAL specimens only), is one component of a comprehensive MRSA colonization surveillance program. It is not intended to diagnose MRSA infection nor to guide or monitor treatment for MRSA infections.   Urine culture     Status: None   Collection Time: 05/26/14  3:18 AM  Result Value Ref Range Status   Specimen Description URINE, CLEAN CATCH  Final   Special Requests NONE  Final   Culture  Setup Time   Final    05/26/2014 21:57 Performed at [place] Performed at [facility]   Final   Culture NO GROWTH Performed at [facility]   Final   Report Status 05/27/2014 FINAL  Final     Studies: US Venous Img Lower Bilateral  05/27/2014   CLINICAL DATA:  Edema in both lower extremities with cellulitis. Patient complains of pain. Color changes and ulcerations.  EXAM: BILATERAL LOWER EXTREMITY VENOUS DOPPLER ULTRASOUND  TECHNIQUE: Gray-scale sonography with graded compression, as well as color Doppler and duplex ultrasound were performed to evaluate the lower extremity deep venous systems from the level of the common femoral vein and including the common femoral, femoral, profunda femoral, popliteal and calf veins including the posterior tibial, peroneal and gastrocnemius veins when visible. The superficial great saphenous vein was also interrogated. Spectral Doppler was utilized to evaluate flow at rest and with distal augmentation maneuvers in the common femoral, femoral and popliteal veins.  COMPARISON:  None.  FINDINGS: RIGHT LOWER EXTREMITY  Normal compressibility, augmentation and color Doppler flow in the right common femoral vein, right femoral vein and right popliteal vein.  Limited evaluation of the deep calf veins. The distal great saphenous vein is compressible and patent.  LEFT LOWER EXTREMITY  Normal compressibility, augmentation and color Doppler flow in the left common femoral vein, left femoral vein and left popliteal vein. Limited evaluation of the left deep calf veins. The distal left great saphenous vein is patent.  Other Findings: There is soft tissue edema, particularly in the left calf.  IMPRESSION: No evidence of deep venous thrombosis.   Electronically Signed   By: Markus Daft M.D.   On: 05/27/2014 16:56    Scheduled Meds: . antiseptic oral rinse  7 mL Mouth Rinse q12n4p  . atorvastatin  40 mg Oral q1800  . chlorhexidine  15 mL Mouth Rinse BID  .  enoxaparin (LOVENOX) injection  80 mg Subcutaneous Q24H  . furosemide  20 mg Intravenous Once  . [START ON 05/30/2014] furosemide  20 mg Oral Daily  . hydrochlorothiazide  12.5 mg Oral Daily  . levalbuterol  0.63 mg Nebulization Q8H  . lisinopril  5 mg Oral Daily  . piperacillin-tazobactam (ZOSYN)  IV  3.375 g Intravenous Q8H  . polyethylene glycol  17 g Oral Daily  . potassium chloride  20 mEq Oral BID  . vancomycin  1,750 mg Intravenous Q12H   Continuous Infusions: . sodium chloride 30 mL/hr at 05/29/14 0150   Assessment and plan:  Principal Problem:   Septic shock Active Problems:   Bilateral cellulitis of lower leg   Diabetic foot ulcers   Acute respiratory failure with hypoxia   Gram-positive cocci bacteremia   DM (diabetes mellitus), type 2 with peripheral vascular complications   Obesity   Sleep apnea   Sinus tachycardia   Poor dentition   1.Septic shock with gram-positive bacteremia.  Etiology appears to be cellulitis and/or bilateral foot ulcers.  Status post Levophed and more than 5 L of IV fluid resuscitation. His blood pressure is with much improvement  better and Levophed was titrated off on 12/8.  One out of 2 blood cultures positive for gram-positive cocci. Identification  pending. Influenza panel negative. Follow-up UA with no significant WBCs or bacteria.  He has now been afebrile for 24 hours.  We'll continue vancomycin and Zosyn for another 24-48 hours before transitioning him to oral medication. Await identification of positive culture.  Bilateral cellulitis of lower legs with bilateral lower extremity edema left greater than right. Clinical improvement with less erythema and edema of both legs. Bilateral lower extremity venous Dopplers were negative for DVT. We'll continue antibiotics as above.   Bilateral plantar metatarsal ulcers. Plain x-rays revealed no obvious osteoporosis. MRI  of his left foot was ordered to rule out osteomyelitis, but the patient could not fit into the MRI scanner.  Podiatrist Dr. Caprice Beaver was consulted and opined that his suspicion for osteomyelitis was low. He did a bedside debridement of both of the plantar ulcers on  12/9. We'll continue to monitor. Continue antibiotics and wound care recommendations per Dr. Caprice Beaver. -Query activity level or weightbearing-will defer to podiatry.  Cardiac arrhythmias with transient  atrial fibrillation with RVR and PVCs . His heart rate has improved and the elevation man been secondary to fever and mild volume depletion.  EKG on 12 /9 suggest atrial fibrillation. EKG on 12/11 reveals sinus rhythm with PVCs and a heart rate of 62 bpm.   2-D echocardiogram is pending. TSH/free T4  Within normal limits. We'll check a magnesium level. Continue aspirin.  Patient denies chest pain.  Hypertension. His blood pressure is better. Continue lisiniopril and  hydrochlorothiazide, recently restarted .  Obstructive sleep apnea. We'll continue BiPAP/CPAP as tolerated.  Acute respiratory failure with hypoxia. Not sure what his underlying baseline oxygen status is, but his PO2 has been consistently in the mid 60s on room air. It improves with nasal cannula oxygen. Continue oxygen  supplementation. Query obesity hypoventilation syndrome. Because of the mild wheezes, Xopenex nebulizer was ordered. Will restart Lasix, but will give one IV dose empirically. Chest x-ray on 12/9 not suggestive of edema, but with recent volume resuscitation, he may have mild vascular congestion. We'll order incentive spirometry for atelectasis.  Diabetes mellitus, type 2. Will continue sliding scale NovoLog.  hemoglobin A1c was 6.3.  Poor dentition.  The patient needs to see a  dentist or oral surgeon given his multiple cavities in the outpatient setting.  Thrombocytopenia. The patient's platelet count was within normal limits on admission, but has decreased. This could be secondary to hemodilution from volume resuscitation and/or antibiotics and/or infection. We'll continue to monitor. He is on Lovenox and SCDs for DVT prophylaxis and bilateral lower extremity edema. Platelet count has improved compared to yesterday. We'll continue to monitor.    -will transfer out of the ICU to telemetry. Will ask podiatry to recommend activity level.  Time spent: 30 minutes.    [facility] Pager [phone]. If 7PM-7AM, please contact night-coverage at [email or web address], password [facility] 05/29/2014, 9:23 AM  LOS: 4 days

## 2014-05-30 ENCOUNTER — Encounter (HOSPITAL_COMMUNITY): Payer: Self-pay | Admitting: Internal Medicine

## 2014-05-30 DIAGNOSIS — I890 Lymphedema, not elsewhere classified: Secondary | ICD-10-CM

## 2014-05-30 DIAGNOSIS — L97529 Non-pressure chronic ulcer of other part of left foot with unspecified severity: Secondary | ICD-10-CM

## 2014-05-30 DIAGNOSIS — E13621 Other specified diabetes mellitus with foot ulcer: Secondary | ICD-10-CM

## 2014-05-30 DIAGNOSIS — L97519 Non-pressure chronic ulcer of other part of right foot with unspecified severity: Secondary | ICD-10-CM

## 2014-05-30 HISTORY — DX: Lymphedema, not elsewhere classified: I89.0

## 2014-05-30 LAB — CBC
HCT: 39.1 % (ref 39.0–52.0)
Hemoglobin: 12.6 g/dL — ABNORMAL LOW (ref 13.0–17.0)
MCH: 31.7 pg (ref 26.0–34.0)
MCHC: 32.2 g/dL (ref 30.0–36.0)
MCV: 98.5 fL (ref 78.0–100.0)
PLATELETS: 175 10*3/uL (ref 150–400)
RBC: 3.97 MIL/uL — ABNORMAL LOW (ref 4.22–5.81)
RDW: 13.6 % (ref 11.5–15.5)
WBC: 6.8 10*3/uL (ref 4.0–10.5)

## 2014-05-30 LAB — COMPREHENSIVE METABOLIC PANEL
ALT: 28 U/L (ref 0–53)
AST: 29 U/L (ref 0–37)
Albumin: 2.9 g/dL — ABNORMAL LOW (ref 3.5–5.2)
Alkaline Phosphatase: 76 U/L (ref 39–117)
Anion gap: 9 (ref 5–15)
BILIRUBIN TOTAL: 0.7 mg/dL (ref 0.3–1.2)
BUN: 12 mg/dL (ref 6–23)
CHLORIDE: 101 meq/L (ref 96–112)
CO2: 33 meq/L — AB (ref 19–32)
Calcium: 9.2 mg/dL (ref 8.4–10.5)
Creatinine, Ser: 0.83 mg/dL (ref 0.50–1.35)
GFR calc Af Amer: >90 mL/min (ref >90)
Glucose, Bld: 108 mg/dL — ABNORMAL HIGH (ref 70–99)
Potassium: 3.8 mEq/L (ref 3.7–5.3)
SODIUM: 143 meq/L (ref 137–147)
Total Protein: 6.6 g/dL (ref 6.0–8.3)

## 2014-05-30 LAB — GLUCOSE, CAPILLARY
GLUCOSE-CAPILLARY: 93 mg/dL (ref 70–99)
GLUCOSE-CAPILLARY: 116 mg/dL — AB (ref 70–99)
Glucose-Capillary: 113 mg/dL — ABNORMAL HIGH (ref 70–99)
Glucose-Capillary: 126 mg/dL — ABNORMAL HIGH (ref 70–99)

## 2014-05-30 MED ORDER — FUROSEMIDE 10 MG/ML IJ SOLN: 20.0000 mg | Freq: Every day | INTRAMUSCULAR | Status: DC

## 2014-05-30 MED ORDER — POTASSIUM CHLORIDE CRYS ER 20 MEQ PO TBCR
30.0000 meq | EXTENDED_RELEASE_TABLET | Freq: Two times a day (BID) | ORAL | Status: DC
  Administered 2014-05-30 – 2014-06-01 (×5): 30 meq via ORAL
  Filled 2014-05-30 (×10): qty 1

## 2014-05-30 MED ORDER — FUROSEMIDE 10 MG/ML IJ SOLN
20.0000 mg | Freq: Two times a day (BID) | INTRAMUSCULAR | Status: DC
  Administered 2014-05-30 – 2014-05-31 (×2): 20 mg via INTRAVENOUS
  Filled 2014-05-30 (×2): qty 2

## 2014-05-30 MED ORDER — LEVOFLOXACIN 750 MG PO TABS
750.0000 mg | ORAL_TABLET | Freq: Every day | ORAL | Status: DC
  Administered 2014-05-31 – 2014-06-02 (×3): 750 mg via ORAL
  Filled 2014-05-30 (×3): qty 1

## 2014-05-30 NOTE — Progress Notes (Signed)
[facility] PROGRESS NOTE  Thomas Carey BMW:[id number] DOB: [birth year] DOA: 05/25/2014 PCP: Purvis Kilts, MD    Code Statu also well s: Full code Family Communication: Discussed with patient; family not available Disposition Plan: Discharge when clinically appropriate.    Consultants:  Podiatry  , Dr. Caprice Beaver  Procedures:  2-D echocardiogram 12/11:Study Conclusions - Left ventricle: The cavity size was normal. Wall thickness was normal. Systolic function was normal. The estimated ejection fraction was in the range of 60% to 65%. - Aortic valve: Valve area (VTI): 4.09 cm^2. Valve area (Vmax): 3.67 cm^2. - Left atrium: The atrium was mildly dilated. - Pericardium, extracardiac: Poorly visualized. Appears to be small circumferential pericardial effusion. - Technically difficult study with suboptimal acoustic windows and imaging.  Bedside debridement of plantar ulcers, Dr. Caprice Beaver, on 05/27/14  Right IJ central line (in the ED)-discontinued on 05/28/14.    Antibiotics:  Levaquin 12/13>>  Zosyn 12/8>>12/12  Vancomycin 12/8>>  HPI/Subjective: Patient has no complaints of chest pain or shortness of breath. No complaints of bilateral foot pain.   Objective: Filed Vitals:   05/30/14 1331  BP: 148/77  Pulse: 59  Temp: 98.1 F (36.7 C)  Resp: 19   temperature afebrile.  Oxygen saturation 98% on nasal cannula oxygen.   Intake/Output Summary (Last 24 hours) at 05/30/14 1434 Last data filed at 05/30/14 0500  Gross per 24 hour  Intake    240 ml  Output    900 ml  Net   -660 ml   Filed Weights   05/27/14 0600 05/28/14 0500 05/29/14 0500  Weight: 184 kg (405 lb 10.3 oz) 187.7 kg (413 lb 12.9 oz) 185.5 kg (408 lb 15.3 oz)    Exam:   General:  Morbidly obese 63 year old man in no acute distress.  Cardiovascular: S1, S2, with bradycardia  and occasional ectopy-EKG on 05/29/14 reveals sinus rhythm with PVCs and heart rate of 62 bpm.    Respiratory:  Lungs with few faint expiratory wheezes in the upper lobes.  Abdomen: Positive bowel sounds, morbidly obese, nontender, nondistended.  Moderate Scrotal edema  Musculoskeletal/extremities:  1-2+ bilateral lower extremity pitting edema; decreased overall .  Decrease in erythema of both legs from below the knees to the ankles.  Bandages on both feet, not taken off (will examine when podiatry reexamines on 12/13 ). Per exam on 05/29/14, the plantar ulcerations on both feet have been debrided of surrounding callus and dry eschar; base appears to be clean; no purulent drainage.  Data Reviewed: Basic Metabolic Panel:  Recent Labs Lab 05/25/14 2135 05/26/14 0250 05/27/14 0520 05/28/14 0416 05/30/14 0554  NA 137 141 139 141 143  K 4.3 4.0 4.4 4.4 3.8  CL 97 104 104 104 101  CO2 23 24 26 28  33*  GLUCOSE 153* 141* 123* 128* 108*  BUN 23 21 12 11 12   CREATININE 1.24 1.12 0.90 0.90 0.83  CALCIUM 9.4 8.0* 8.3* 8.4 9.2   Liver Function Tests:  Recent Labs Lab 05/25/14 2135 05/30/14 0554  AST 19 29  ALT 10 28  ALKPHOS 81 76  BILITOT 0.6 0.7  PROT 7.2 6.6  ALBUMIN 3.4* 2.9*   No results for input(s): LIPASE, AMYLASE in the last 168 hours. No results for input(s): AMMONIA in the last 168 hours. CBC:  Recent Labs Lab 05/25/14 2111 05/26/14 0250 05/27/14 0520 05/28/14 0416 05/30/14 0554  WBC 15.5* 12.3* 6.4 5.8 6.8  NEUTROABS 14.8*  --   --   --   --  HGB 14.2 12.8* 13.3 12.0* 12.6*  HCT 42.5 38.4* 40.8 37.6* 39.1  MCV 96.6 97.2 99.3 100.0 98.5  PLT 170 151 125* 134* 175   Cardiac Enzymes:  Recent Labs Lab 05/26/14 0628  TROPONINI <0.30   BNP (last 3 results)  Recent Labs  05/26/14 0628  PROBNP 881.7*   CBG:  Recent Labs Lab 05/29/14 1138 05/29/14 1647 05/29/14 2122 05/30/14 0746 05/30/14 1132  GLUCAP 120* 104* 107* 93 116*    Recent Results (from the past 240 hour(s))  Culture, blood (routine x 2)     Status: None   Collection  Time: 05/25/14  9:35 PM  Result Value Ref Range Status   Specimen Description BLOOD LEFT HAND  Final   Special Requests BOTTLES DRAWN AEROBIC AND ANAEROBIC [facility]  Final   Culture  Setup Time   Final    05/27/2014 15:03 Performed at [facility]    Culture   Final    VIRIDANS STREPTOCOCCUS Note: Gram Stain Report Called to,Read Back By and Verified With: LEE B AT [id number] 05/27/14 BY FORSYTH K Performed at [facility] Performed at [facility]    Report Status 05/29/2014 FINAL  Final  Culture, blood (routine x 2)     Status: None (Preliminary result)   Collection Time: 05/25/14  9:35 PM  Result Value Ref Range Status   Specimen Description BLOOD LEFT HAND  Final   Special Requests BOTTLES DRAWN AEROBIC AND ANAEROBIC 6CC  Final   Culture NO GROWTH 3 DAYS  Final   Report Status PENDING  Incomplete  MRSA PCR Screening     Status: None   Collection Time: 05/26/14  2:00 AM  Result Value Ref Range Status   MRSA by PCR NEGATIVE NEGATIVE Final    Comment:        The GeneXpert MRSA Assay (FDA approved for NASAL specimens only), is one component of a comprehensive MRSA colonization surveillance program. It is not intended to diagnose MRSA infection nor to guide or monitor treatment for MRSA infections.   Urine culture     Status: None   Collection Time: 05/26/14  3:18 AM  Result Value Ref Range Status   Specimen Description URINE, CLEAN CATCH  Final   Special Requests NONE  Final   Culture  Setup Time   Final    05/26/2014 21:57 Performed at [place] Performed at [facility]   Final   Culture NO GROWTH Performed at [facility]   Final   Report Status 05/27/2014 FINAL  Final     Studies: No results found.  Scheduled Meds: . antiseptic oral rinse  7 mL Mouth Rinse q12n4p  . atorvastatin  40 mg Oral q1800  . chlorhexidine  15 mL Mouth Rinse BID  . enoxaparin (LOVENOX) injection  80 mg Subcutaneous  Q24H  . furosemide  20 mg Oral Daily  . hydrochlorothiazide  12.5 mg Oral Daily  . levalbuterol  0.63 mg Nebulization Q8H  . lisinopril  5 mg Oral Daily  . piperacillin-tazobactam (ZOSYN)  IV  3.375 g Intravenous Q8H  . polyethylene glycol  17 g Oral Daily  . potassium chloride  20 mEq Oral BID  . vancomycin  1,750 mg Intravenous Q12H   Continuous Infusions: . sodium chloride 30 mL/hr at 05/30/14 1120   Assessment and plan:  Principal Problem:   Septic shock Active Problems:   Bilateral cellulitis of lower leg   Diabetic  foot ulcers   Acute respiratory failure with hypoxia   Gram-positive cocci bacteremia   Lymphedema of both lower extremities   DM (diabetes mellitus), type 2 with peripheral vascular complications   Obesity   Sleep apnea   Sinus tachycardia   Poor dentition   1.Septic shock with gram-positive bacteremia. 1 of 2 cultures growing out strep viridans.  Resolved. Etiology appears to be cellulitis and/or bilateral foot ulcers.  Status post Levophed and more than 5 L of IV fluid resuscitation. His blood pressure is with much improvement  better and Levophed was titrated off on 12/8.  One out of 2 blood cultures positive for Strep Viridans.  Influenza panel negative. Follow-up UA with no significant WBCs or bacteria.  He has now been afebrile for 24 hours.  We'll continue vancomycin, but will discontinue Zosyn and start oral Levaquin.  Bilateral cellulitis of lower legs with bilateral lower extremity edema left greater than right. Clinical improvement with less erythema and edema of both legs. Bilateral lower extremity venous Dopplers were negative for DVT. We'll continue antibiotics as above.   Bilateral plantar metatarsal ulcers. Plain x-rays revealed no obvious osteoporosis. MRI  of his left foot was ordered to rule out osteomyelitis, but the patient could not fit into the MRI scanner.  Podiatrist Dr. Caprice Beaver was consulted and opined that his suspicion for  osteomyelitis was low. He did a bedside debridement of both of the plantar ulcers on 05/27/14. Per my conversation with Dr. Caprice Beaver and our examination on 12/11, the ulcers look good; no purulent drainage. We'll continue to monitor. Continue antibiotics and wound care recommendations per Dr. Caprice Beaver. We'll order PT for partial weight bearing with postoperative shoe and walker.  Cardiac arrhythmias with transient  atrial fibrillation with RVR and PVCs . His heart rate has improved and the elevation man been secondary to fever and mild volume depletion.  EKG on 12 /9 suggest atrial fibrillation. EKG on 12/11 reveals sinus rhythm with PVCs and a heart rate of 62 bpm.   2-D echocardiogram is pending. TSH/free T4  Within normal limits. We'll check a magnesium level. Continue aspirin.  Patient denies chest pain.  Hypertension. His blood pressure is better. Continue lisiniopril and and hold HCTZ with the start of IV lasix.  Obstructive sleep apnea. We'll continue BiPAP/CPAP as tolerated.  Acute respiratory failure with hypoxia. Not sure what his underlying baseline oxygen status is, but his PO2 was in the mid 60s on room air following admission. It improves with nasal cannula oxygen. Continue oxygen supplementation. Query obesity hypoventilation syndrome. Because of the mild wheezes, Xopenex nebulizer was ordered. Will give IV lasix x 2 days for scrotal edema.  Chest x-ray on 12/9 not suggestive of edema, but with recent volume resuscitation, he may have mild vascular congestion. Continue incentive spirometry for atelectasis.  Diabetes mellitus, type 2. Will continue sliding scale NovoLog.  hemoglobin A1c was 6.3.  Poor dentition.  The patient needs to see a dentist or oral surgeon given his multiple cavities in the outpatient setting.  Thrombocytopenia. Now resolved. The patient's platelet count was within normal limits on admission, but had decreased. This could have been secondary  to hemodilution from volume resuscitation and/or antibiotics and/or infection.       Time spent: 30 minutes.    [facility] Pager [phone]. If 7PM-7AM, please contact night-coverage at [email or web address], password [facility] 05/30/2014, 2:34 PM  LOS: 5 days

## 2014-05-31 LAB — GLUCOSE, CAPILLARY
GLUCOSE-CAPILLARY: 128 mg/dL — AB (ref 70–99)
Glucose-Capillary: 103 mg/dL — ABNORMAL HIGH (ref 70–99)
Glucose-Capillary: 129 mg/dL — ABNORMAL HIGH (ref 70–99)
Glucose-Capillary: 77 mg/dL (ref 70–99)

## 2014-05-31 LAB — CULTURE, BLOOD (ROUTINE X 2): CULTURE: NO GROWTH

## 2014-05-31 MED ORDER — FUROSEMIDE 20 MG PO TABS
20.0000 mg | ORAL_TABLET | Freq: Every day | ORAL | Status: DC
  Administered 2014-06-01 – 2014-06-02 (×2): 20 mg via ORAL
  Filled 2014-05-31 (×2): qty 1

## 2014-05-31 NOTE — Progress Notes (Signed)
Podiatry Progress Note  Subjective: Thomas Carey is a 63 year old male who is being followed for ulcerations of both feet.  He sitting at bedside.  States that he is feeling some better.    Past Medical History  Diagnosis Date  . Obesity   . Sleep apnea   . HTN (hypertension)   . Lymphedema of both lower extremities 05/30/2014  . Diabetic foot ulcers 05/27/2014  . DM (diabetes mellitus), type 2 with peripheral vascular complications   . Septic shock 05/26/2014    ? sec to BLE cellulitis/foot ulcers   Scheduled Meds: . atorvastatin  40 mg Oral q1800  . enoxaparin (LOVENOX) injection  80 mg Subcutaneous Q24H  . [START ON 06/01/2014] furosemide  20 mg Oral Daily  . levalbuterol  0.63 mg Nebulization Q8H  . levofloxacin  750 mg Oral Daily  . lisinopril  5 mg Oral Daily  . polyethylene glycol  17 g Oral Daily  . potassium chloride  30 mEq Oral BID  . vancomycin  1,750 mg Intravenous Q12H   Continuous Infusions: . sodium chloride 30 mL/hr at 05/30/14 1120   PRN Meds:.acetaminophen **OR** acetaminophen, bisacodyl, LORazepam, ondansetron **OR** ondansetron (ZOFRAN) IV  No Known Allergies History reviewed. No pertinent past surgical history. Family History  Problem Relation Age of Onset  . Heart disease Brother    Social History:  reports that he has never smoked. He does not have any smokeless tobacco history on file. He reports that he does not drink alcohol or use illicit drugs.  Physical Examination: Vital signs in last 24 hours:   Temp:  [97.7 F (36.5 C)-99.1 F (37.3 C)] 97.7 F (36.5 C) (12/13 1452) Pulse Rate:  [52-82] 55 (12/13 1452) Resp:  [20] 20 (12/13 1452) BP: (124-136)/(65-71) 130/70 mmHg (12/13 1452) SpO2:  [94 %-100 %] 98 % (12/13 1452) FiO2 (%):  [35 %] 35 % (12/13 0003)  Left foot:  Dressing is in place.  Skin is warm.  Skin of the lower leg is indurated.  There is a full thickness ulceration present along the plantar aspect of the first metatarsal head.   There is further clinical improvement in the appearance of the ulceration.  Dorsalis pedis pulse is palpable.  I wasn't able to palpate a posterior tibial pulse secondary to edema.  There is some edema of the lower extremity.  Light touch sensation is diminished.  Right foot:  Dressing is in place.  Skin is warm.  Skin of the lower leg is indurated.  There is a full thickness ulceration present along the plantar aspect of the first metatarsal head.  There is further clinical improvement in the appearance of the ulceration.  Dorsalis pedis pulse is palpable.  I wasn't able to palpate a posterior tibial pulse secondary to edema.  There is some edema of the lower extremity.  Light touch sensation is diminished.   Lab/Test Results:    Recent Labs  05/30/14 0554  WBC 6.8  HGB 12.6*  HCT 39.1  PLT 175  NA 143  K 3.8  CL 101  CO2 33*  BUN 12  CREATININE 0.83  GLUCOSE 108*  CALCIUM 9.2    Recent Results (from the past 240 hour(s))  Culture, blood (routine x 2)     Status: None   Collection Time: 05/25/14  9:35 PM  Result Value Ref Range Status   Specimen Description BLOOD LEFT HAND  Final   Special Requests BOTTLES DRAWN AEROBIC AND ANAEROBIC [facility]  Final   Culture  Setup Time   Final    05/27/2014 15:03 Performed at [facility]    Culture   Final    VIRIDANS STREPTOCOCCUS Note: Gram Stain Report Called to,Read Back By and Verified With: LEE B AT 7622 05/27/14 BY FORSYTH K Performed at [facility] Performed at [facility]    Report Status 05/29/2014 FINAL  Final  Culture, blood (routine x 2)     Status: None   Collection Time: 05/25/14  9:35 PM  Result Value Ref Range Status   Specimen Description BLOOD LEFT HAND  Final   Special Requests BOTTLES DRAWN AEROBIC AND ANAEROBIC 6CC  Final   Culture NO GROWTH 6 DAYS  Final   Report Status 05/31/2014 FINAL  Final  MRSA PCR Screening     Status: None   Collection Time: 05/26/14  2:00 AM  Result Value Ref Range  Status   MRSA by PCR NEGATIVE NEGATIVE Final    Comment:        The GeneXpert MRSA Assay (FDA approved for NASAL specimens only), is one component of a comprehensive MRSA colonization surveillance program. It is not intended to diagnose MRSA infection nor to guide or monitor treatment for MRSA infections.   Urine culture     Status: None   Collection Time: 05/26/14  3:18 AM  Result Value Ref Range Status   Specimen Description URINE, CLEAN CATCH  Final   Special Requests NONE  Final   Culture  Setup Time   Final    05/26/2014 21:57 Performed at [place] Performed at [facility]   Final   Culture NO GROWTH Performed at [facility]   Final   Report Status 05/27/2014 FINAL  Final     No results found.  Assessment: 1.  Cellulitis. 2.  Diabetic ulceration of the left foot. 3.  Diabetic ulceration of the right foot. 4.  Diabetes mellitus with peripheral neuropathy. 5.  Lymphedema, both lower extremitites.    Plan: An Aquacel dressing was applied to both feet.  Antibiotics per primary team.  PT order pending.  He is PWB on both lower extremities with postoperative shoe and use of walker.  Upon discharge, his dressings will need to be changed QOD using Aquacel.  Dr. Laurena Spies will see him tomorrow.  He is to follow-up with me at my private office on 06/09/2014 at 10:00 AM.  Long-term he would benefit from diabetic shoes with a soft upper to accommodate for his pedal edema and inserts to accommodate for the ulcerations.  He would also benefit from routine diabetic foot care.  I will address both of these recommendations at his office visit.     Aricela Bertagnolli IVAN 05/31/2014, 3:06 PM

## 2014-05-31 NOTE — Progress Notes (Signed)
[facility] PROGRESS NOTE  Thomas Carey Thomas Carey DOB: [birth year] DOA: 05/25/2014 PCP: Purvis Kilts, MD    Code Statu also well s: Full code Family Communication: Discussed with patient; family not available Disposition Plan: Discharge when clinically appropriate, hopefully with the next 48 hours. Patient may need short-term skilled nursing facility placement, but will wait until PT evaluates him.    Consultants:  Podiatry -Dr. Caprice Beaver  Procedures:  2-D echocardiogram 12/11:Study Conclusions - Left ventricle: The cavity size was normal. Wall thickness was normal. Systolic function was normal. The estimated ejection fraction was in the range of 60% to 65%. - Aortic valve: Valve area (VTI): 4.09 cm^2. Valve area (Vmax): 3.67 cm^2. - Left atrium: The atrium was mildly dilated. - Pericardium, extracardiac: Poorly visualized. Appears to be small circumferential pericardial effusion. - Technically difficult study with suboptimal acoustic windows and imaging.  Bedside debridement of plantar ulcers, Dr. Caprice Beaver, on 05/27/14  Right IJ central line (in the ED)-discontinued on 05/28/14.    Antibiotics:  Levaquin 12/13>>  Zosyn 12/8>>12/12  Vancomycin 12/8>>  HPI/Subjective: Patient has no complaints of chest pain or shortness of breath. No complaints of bilateral foot pain. He has less scrotal edema.   Objective: Filed Vitals:   05/31/14 0500  BP: 124/71  Pulse: 52  Temp: 98.6 F (37 C)  Resp: 20   temperature afebrile.  Oxygen saturation 100%  on nasal cannula oxygen.   Intake/Output Summary (Last 24 hours) at 05/31/14 1234 Last data filed at 05/31/14 9937  Gross per 24 hour  Intake    480 ml  Output   3725 ml  Net  -3245 ml   Filed Weights   05/27/14 0600 05/28/14 0500 05/29/14 0500  Weight: 184 kg (405 lb 10.3 oz) 187.7 kg (413 lb 12.9 oz) 185.5 kg (408 lb 15.3 oz)    Exam:   General:  Morbidly obese 63 year old man in no  acute distress.  Cardiovascular: S1, S2, with bradycardia  an occasional ectopy. (EKG on 05/29/14 reveals sinus rhythm with         PVCs and heart rate of 62 bpm.)   Respiratory:  Lungs with few faint expiratory wheezes in the upper lobes.  Abdomen: Positive bowel sounds, morbidly obese, nontender, nondistended.  Moderate Scrotal edema-resolved.  Musculoskeletal/extremities:  1 + bilateral lower extremity pitting edema; decreased overall .  Decrease in         erythema of both legs from below the knees to the ankles.  Bandages on both feet, not taken off (will examine when podiatry reexamines on 12/13 ). Per exam on 05/29/14, the plantar ulcerations on both feet have been debrided of surrounding callus and dry eschar; base appears to be clean; no purulent drainage.  Neurologic: He is alert and oriented 3.  Data Reviewed: Basic Metabolic Panel:  Recent Labs Lab 05/25/14 2135 05/26/14 0250 05/27/14 0520 05/28/14 0416 05/30/14 0554  NA 137 141 139 141 143  K 4.3 4.0 4.4 4.4 3.8  CL 97 104 104 104 101  CO2 23 24 26 28  33*  GLUCOSE 153* 141* 123* 128* 108*  BUN 23 21 12 11 12   CREATININE 1.24 1.12 0.90 0.90 0.83  CALCIUM 9.4 8.0* 8.3* 8.4 9.2   Liver Function Tests:  Recent Labs Lab 05/25/14 2135 05/30/14 0554  AST 19 29  ALT 10 28  ALKPHOS 81 76  BILITOT 0.6 0.7  PROT 7.2 6.6  ALBUMIN 3.4* 2.9*   No results for input(s): LIPASE, AMYLASE in the last  168 hours. No results for input(s): AMMONIA in the last 168 hours. CBC:  Recent Labs Lab 05/25/14 2111 05/26/14 0250 05/27/14 0520 05/28/14 0416 05/30/14 0554  WBC 15.5* 12.3* 6.4 5.8 6.8  NEUTROABS 14.8*  --   --   --   --   HGB 14.2 12.8* 13.3 12.0* 12.6*  HCT 42.5 38.4* 40.8 37.6* 39.1  MCV 96.6 97.2 99.3 100.0 98.5  PLT 170 151 125* 134* 175   Cardiac Enzymes:  Recent Labs Lab 05/26/14 0628  TROPONINI <0.30   BNP (last 3 results)  Recent Labs  05/26/14 0628  PROBNP 881.7*   CBG:  Recent  Labs Lab 05/30/14 1132 05/30/14 1646 05/30/14 2055 05/31/14 0738 05/31/14 1128  GLUCAP 116* 113* 126* 103* 129*    Recent Results (from the past 240 hour(s))  Culture, blood (routine x 2)     Status: None   Collection Time: 05/25/14  9:35 PM  Result Value Ref Range Status   Specimen Description BLOOD LEFT HAND  Final   Special Requests BOTTLES DRAWN AEROBIC AND ANAEROBIC [facility]  Final   Culture  Setup Time   Final    05/27/2014 15:03 Performed at [facility]    Culture   Final    VIRIDANS STREPTOCOCCUS Note: Gram Stain Report Called to,Read Back By and Verified With: LEE B AT 7915 05/27/14 BY FORSYTH K Performed at [facility] Performed at [facility]    Report Status 05/29/2014 FINAL  Final  Culture, blood (routine x 2)     Status: None   Collection Time: 05/25/14  9:35 PM  Result Value Ref Range Status   Specimen Description BLOOD LEFT HAND  Final   Special Requests BOTTLES DRAWN AEROBIC AND ANAEROBIC 6CC  Final   Culture NO GROWTH 6 DAYS  Final   Report Status 05/31/2014 FINAL  Final  MRSA PCR Screening     Status: None   Collection Time: 05/26/14  2:00 AM  Result Value Ref Range Status   MRSA by PCR NEGATIVE NEGATIVE Final    Comment:        The GeneXpert MRSA Assay (FDA approved for NASAL specimens only), is one component of a comprehensive MRSA colonization surveillance program. It is not intended to diagnose MRSA infection nor to guide or monitor treatment for MRSA infections.   Urine culture     Status: None   Collection Time: 05/26/14  3:18 AM  Result Value Ref Range Status   Specimen Description URINE, CLEAN CATCH  Final   Special Requests NONE  Final   Culture  Setup Time   Final    05/26/2014 21:57 Performed at [facility] Performed at [facility]   Final   Culture NO GROWTH Performed at [facility]   Final   Report Status 05/27/2014 FINAL  Final     Studies: No  results found.  Scheduled Meds: . atorvastatin  40 mg Oral q1800  . enoxaparin (LOVENOX) injection  80 mg Subcutaneous Q24H  . furosemide  20 mg Intravenous Q12H  . levalbuterol  0.63 mg Nebulization Q8H  . levofloxacin  750 mg Oral Daily  . lisinopril  5 mg Oral Daily  . polyethylene glycol  17 g Oral Daily  . potassium chloride  30 mEq Oral BID  . vancomycin  1,750 mg Intravenous Q12H   Continuous Infusions: . sodium chloride 30 mL/hr at 05/30/14 1120   Assessment and plan:  Principal Problem:   Septic shock Active Problems:   Bilateral cellulitis of lower leg   Diabetic foot ulcers   Acute respiratory failure with hypoxia   Gram-positive cocci bacteremia   Lymphedema of both lower extremities   DM (diabetes mellitus), type 2 with peripheral vascular complications   Obesity   Sleep apnea   Sinus tachycardia   Poor dentition   1.Septic shock with gram-positive bacteremia. 1 of 2 cultures growing out strep viridans.  Resolved. Etiology appears to be cellulitis and/or bilateral foot ulcers.  Status post Levophed and more than 5 L of IV fluid resuscitation. His blood pressure is with much improvement  better and Levophed was titrated off on 12/8.  One out of 2 blood cultures positive for Strep Viridans.  Influenza panel negative. Follow-up UA with no significant WBCs or bacteria.  He has been afebrile for several days.  We'll continue vancomycin and oral Levaquin. Zosyn discontinued on 12/12.  Bilateral cellulitis of lower legs with bilateral lower extremity edema left greater than right. Clinical improvement with less erythema and edema of both legs. Bilateral lower extremity venous Dopplers were negative for DVT. We'll continue antibiotics as above.   Bilateral plantar metatarsal ulcers. Plain x-rays revealed no obvious osteoporosis. MRI  of his left foot was ordered to rule out osteomyelitis, but the patient could not fit into the MRI scanner.  Podiatrist Dr.  Caprice Beaver was consulted and opined that his suspicion for osteomyelitis was low. He did a bedside debridement of both of the plantar ulcers on 05/27/14. Per my conversation with Dr. Caprice Beaver and our examination on 12/11, the ulcers look good; no purulent drainage. We'll continue to monitor. Continue antibiotics and wound care recommendations per Dr. Caprice Beaver. We'll order PT for partial weight bearing with postoperative shoe and walker.  Cardiac arrhythmias with transient  atrial fibrillation with RVR and PVCs . His heart rate has improved and the elevation man been secondary to fever and mild volume depletion.  EKG on 12 /9 suggest atrial fibrillation. EKG on 12/11 reveals sinus rhythm with PVCs and a heart rate of 62 bpm.   2-D echocardiogram is pending. TSH/free T4 within normal limits. We'll check a magnesium level. Continue aspirin.  Patient denies chest pain.  Hypertension. His blood pressure is better. Continue lisiniopril and and hold HCTZ with the start of IV lasix.  Obstructive sleep apnea. We'll continue BiPAP/CPAP as tolerated.  Acute respiratory failure with hypoxia. Not sure what his underlying baseline oxygen status is, but his PO2 was in the mid 60s on room air following admission. It improves with nasal cannula oxygen. Continue oxygen supplementation. Query obesity hypoventilation syndrome. Because of the mild wheezes, Xopenex nebulizer was ordered. Given Lasix IV 24 hours for for scrotal edema; now resolved.  Chest x-ray on 12/9 not suggestive of edema, but with recent volume resuscitation, he may have mild vascular congestion. Continue incentive spirometry for atelectasis. Restart oral Lasix.  Diabetes mellitus, type 2. Will continue sliding scale NovoLog.  hemoglobin A1c was 6.3.  Poor dentition.  The patient needs to see a dentist or oral surgeon given his multiple cavities in the outpatient setting.  Thrombocytopenia. Now resolved. The patient's platelet  count was within normal limits on admission, but had decreased. This could have been secondary to hemodilution from volume resuscitation and/or antibiotics and/or infection.    Out of bed to the chair with assistance today if possible.       Time spent: 25 minutes.    [facility]  Pager [phone]. If 7PM-7AM, please contact night-coverage at [email or web address], password [facility] 05/31/2014, 12:34 PM  LOS: 6 days

## 2014-06-01 LAB — GLUCOSE, CAPILLARY
GLUCOSE-CAPILLARY: 113 mg/dL — AB (ref 70–99)
GLUCOSE-CAPILLARY: 98 mg/dL (ref 70–99)
Glucose-Capillary: 132 mg/dL — ABNORMAL HIGH (ref 70–99)
Glucose-Capillary: 99 mg/dL (ref 70–99)

## 2014-06-01 LAB — BASIC METABOLIC PANEL
ANION GAP: 7 (ref 5–15)
BUN: 13 mg/dL (ref 6–23)
CALCIUM: 9.1 mg/dL (ref 8.4–10.5)
CO2: 34 mEq/L — ABNORMAL HIGH (ref 19–32)
Chloride: 103 mEq/L (ref 96–112)
Creatinine, Ser: 0.83 mg/dL (ref 0.50–1.35)
GFR calc Af Amer: >90 mL/min (ref >90)
GFR calc non Af Amer: >90 mL/min (ref >90)
Glucose, Bld: 116 mg/dL — ABNORMAL HIGH (ref 70–99)
Potassium: 4.4 mEq/L (ref 3.7–5.3)
SODIUM: 144 meq/L (ref 137–147)

## 2014-06-01 LAB — CBC
HCT: 38.6 % — ABNORMAL LOW (ref 39.0–52.0)
HEMOGLOBIN: 12.3 g/dL — AB (ref 13.0–17.0)
MCH: 31.4 pg (ref 26.0–34.0)
MCHC: 31.9 g/dL (ref 30.0–36.0)
MCV: 98.5 fL (ref 78.0–100.0)
Platelets: 214 10*3/uL (ref 150–400)
RBC: 3.92 MIL/uL — ABNORMAL LOW (ref 4.22–5.81)
RDW: 13.7 % (ref 11.5–15.5)
WBC: 7.5 10*3/uL (ref 4.0–10.5)

## 2014-06-01 NOTE — Evaluation (Addendum)
Physical Therapy Evaluation Patient Details Name: Thomas Carey MRN: [id number] DOB: 10-Jan-1951 Today's Date: 06/01/2014   History of Present Illness  63 yo male h/o dm, diabetic foot ulcers, htn comes in with one day of rigors /chills occurred earlier today that prompted him to come to the ED.  Says he felt awful.  Was in his usual state of health when this suddenly came on.  Denies any recent illnesses.  He has been fighting foot ulcers for many years, but has not seen a podiatrist about it for 2 years.  Wounds started developing about a year ago and progressivly gotten worse.  He has chronic lympedema to bilateral LE, this is actually better than it usually is.  His left foot is normally more swollen than his right and this is also no different than usual.  Denies any new redness or problems with his legs.  Denies any cough.  No n/v/d.  No chest pain or abd pain.  No dysuria.  No recent hospitalizations.  No resp symptoms.  He has received 5 liters ivf and tyenlol in the ED and says he feels much better.  On arrival he was hypotensive with a temp of 104.  Pt lives alone and had been completely independent at home-"I just walk slow".  Clinical Impression   Pt was seen for evaluation.  He is a very pleasant pt with morbid obesity.  He has developed weakness and deconditioning from this recent illness and is now unable to ambulate because of this.  In addition, he has wounds on the distal aspect of the 1st metatarsals bilaterally.  He is to be PWB on both LEs which technically is not possible, even with a walker.  Dr. Caprice Beaver has requested bilateral post op shoes for the patient which I can obtain, but I am not clear that this will offer any decrease in weight bearing.  He is apparently not available today.  For the transfer bed to chair using a bariatric walker, I did instruct the pt in primary weight bearing on the lateral aspect of both feet.  Ultimately, he will probably need a modification of a  post op shoe (he has one at home for the right foot)- [employer] is one brand on the market.     These can be obtained from catalogs but I also thought that Dr. Caprice Beaver also has them in his office.  For now, I will obtain a traditional post op shoe.  Pt will need to transition to SNF at d/c.  He is in agreement.    Follow Up Recommendations SNF    Equipment Recommendations  Rolling walker with 5" wheels (bariatric)    Recommendations for Other Services   OT    Precautions / Restrictions Precautions Precautions: Fall Restrictions Weight Bearing Restrictions: Yes RLE Weight Bearing: Partial weight bearing RLE Partial Weight Bearing Percentage or Pounds: pt has a wound at distal head of the 1st metatarsal for which weight bearing should be decreased.Marland KitchenMarland KitchenPodiatrist has requested that pt use a post op shoe LLE Weight Bearing: Partial weight bearing LLE Partial Weight Bearing Percentage or Pounds: pt has a wound at distal head of 1st metatarsal for which weight bearing should be decreased.Marland KitchenMarland KitchenPodiatrist has requested that pt use a post op shoe      Mobility  Bed Mobility Overal bed mobility: Modified Independent             General bed mobility comments: HOB elevated  Transfers Overall transfer level: Needs assistance Equipment used: Rolling  walker (2 wheeled) (bariatric) Transfers: Sit to/from [facility] Sit to Stand: Min guard Stand pivot transfers: Min guard (using a walker)       General transfer comment: pt instructed in hand placement...it took several attempts for him to achieve the standing position...instruction given for anterior weight shift...once standing, pt was able to pivot to the chair with primary weight bearing on the lateral aspect of both feet...he was too weak to try to walk  Ambulation/Gait Ambulation/Gait assistance:  (unable to ambulate)              Stairs            Wheelchair Mobility    Modified Rankin (Stroke Patients  Only)       Balance Overall balance assessment: Modified Independent                                           Pertinent Vitals/Pain Pain Assessment: No/denies pain    Home Living Family/patient expects to be discharged to:: Skilled nursing facility                      Prior Function Level of Independence: Independent               Hand Dominance        Extremity/Trunk Assessment   Upper Extremity Assessment: Generalized weakness           Lower Extremity Assessment: Generalized weakness      Cervical / Trunk Assessment: Normal  Communication   Communication: HOH (no hearing in the right ear)  Cognition Arousal/Alertness: Awake/alert Behavior During Therapy: WFL for tasks assessed/performed Overall Cognitive Status: Within Functional Limits for tasks assessed                      General Comments      Exercises        Assessment/Plan    PT Assessment Patient needs continued PT services  PT Diagnosis Difficulty walking;Generalized weakness   PT Problem List Decreased strength;Decreased activity tolerance;Decreased mobility;Obesity;Decreased knowledge of precautions  PT Treatment Interventions Gait training;Functional mobility training;Therapeutic exercise   PT Goals (Current goals can be found in the Care Plan section) Acute Rehab PT Goals Patient Stated Goal: return to independence at home PT Goal Formulation: With patient Time For Goal Achievement: 06/15/14 Potential to Achieve Goals: Good    Frequency Min 3X/week   Barriers to discharge Decreased caregiver support pt lives alone    Co-evaluation               End of Session   Activity Tolerance: Patient tolerated treatment well Patient left: in chair;with call bell/phone within reach;with chair alarm set Nurse Communication: Mobility status;Weight bearing status         Time: 8469-6295 PT Time Calculation (min) (ACUTE ONLY): 42  min   Charges:   PT Evaluation $Initial PT Evaluation Tier I: 1 Procedure     PT G CodesOwens Shark, Rosemarie Ax L 06/01/2014, 12:12 PM

## 2014-06-01 NOTE — Consult Note (Signed)
Podiatry Progress Note  History of Present Illness: Thomas Carey is a 63 y.o. male with bilateral plantar foot diabetic ulcerations.  Patient was seen in his chair eating lunch.  Patient states he's had the right ulcer for 2-3 years and the left ulcer for "couple of months."  He has not had regular follow-up for the wounds and states that he has been applying a dry dressing at home.  Patient denies nausea, vomiting, fever,or chills.   Past Medical History  Diagnosis Date  . Obesity   . Sleep apnea   . HTN (hypertension)   . Lymphedema of both lower extremities 05/30/2014  . Diabetic foot ulcers 05/27/2014  . DM (diabetes mellitus), type 2 with peripheral vascular complications   . Septic shock 05/26/2014    ? sec to BLE cellulitis/foot ulcers   Scheduled Meds: . atorvastatin  40 mg Oral q1800  . enoxaparin (LOVENOX) injection  80 mg Subcutaneous Q24H  . furosemide  20 mg Oral Daily  . levalbuterol  0.63 mg Nebulization Q8H  . levofloxacin  750 mg Oral Daily  . lisinopril  5 mg Oral Daily  . polyethylene glycol  17 g Oral Daily  . potassium chloride  30 mEq Oral BID   Continuous Infusions: . sodium chloride 30 mL/hr at 06/01/14 1607   PRN Meds:.acetaminophen **OR** acetaminophen, bisacodyl, LORazepam, ondansetron **OR** ondansetron (ZOFRAN) IV  No Known Allergies History reviewed. No pertinent past surgical history. Family History  Problem Relation Age of Onset  . Heart disease Brother    Social History:  reports that he has never smoked. He does not have any smokeless tobacco history on file. He reports that he does not drink alcohol or use illicit drugs.  Review of Systems: No nausea, vomiting, fever, or chills  Physical Examination: Vital signs in last 24 hours:   Temp:  [97.9 F (36.6 C)-98.8 F (37.1 C)] 98.3 F (36.8 C) (12/14 2048) Pulse Rate:  [58-78] 64 (12/14 2048) Resp:  [20] 20 (12/14 2048) BP: (126-171)/(54-75) 171/54 mmHg (12/14 2048) SpO2:  [92  %-96 %] 92 % (12/14 2048)  Bilateral lower extremity exam:  Bilateral DP 2/4 and PT nonpalpable.  Feet are warm to touch.  Bilateral lower extremities with pitting edema and erythema along with brawny skin noted. Edema extends distally to the foot and ankle.  Right submetatarsal head 1 ulceration with a fiber granular wound bed, no purulence expressed, and no pain upon palpation.  Right plantar foot ulceration measures approximately 0.8 x 0.7 x 0.1 cm.  Left submetatarsal head 1 ulceration with hyperkeratotic periwound and fibrotic wound bed.  No purulence was expressed and there is no pain upon palpation.  Left plantar foot ulceration measures approximately 0.5 x 0.3 x 0.5 cm.  Both wounds do not probe to bone.  Epicritic sensation is diminished bilaterally.  Lab/Test Results:   Recent Labs  05/30/14 0554 06/01/14 0627  WBC 6.8 7.5  HGB 12.6* 12.3*  HCT 39.1 38.6*  PLT 175 214  NA 143 144  K 3.8 4.4  CL 101 103  CO2 33* 34*  BUN 12 13  CREATININE 0.83 0.83  GLUCOSE 108* 116*  CALCIUM 9.2 9.1    Recent Results (from the past 240 hour(s))  Culture, blood (routine x 2)     Status: None   Collection Time: 05/25/14  9:35 PM  Result Value Ref Range Status   Specimen Description BLOOD LEFT HAND  Final   Special Requests BOTTLES DRAWN AEROBIC AND ANAEROBIC 6CC  Final   Culture  Setup Time   Final    05/27/2014 15:03 Performed at [facility]    Culture   Final    VIRIDANS STREPTOCOCCUS Note: Gram Stain Report Called to,Read Back By and Verified With: LEE B AT 8295 05/27/14 BY FORSYTH K Performed at [facility] Performed at [facility]    Report Status 05/29/2014 FINAL  Final  Culture, blood (routine x 2)     Status: None   Collection Time: 05/25/14  9:35 PM  Result Value Ref Range Status   Specimen Description BLOOD LEFT HAND  Final   Special Requests BOTTLES DRAWN AEROBIC AND ANAEROBIC 6CC  Final   Culture NO GROWTH 6 DAYS  Final   Report Status  05/31/2014 FINAL  Final  MRSA PCR Screening     Status: None   Collection Time: 05/26/14  2:00 AM  Result Value Ref Range Status   MRSA by PCR NEGATIVE NEGATIVE Final    Comment:        The GeneXpert MRSA Assay (FDA approved for NASAL specimens only), is one component of a comprehensive MRSA colonization surveillance program. It is not intended to diagnose MRSA infection nor to guide or monitor treatment for MRSA infections.   Urine culture     Status: None   Collection Time: 05/26/14  3:18 AM  Result Value Ref Range Status   Specimen Description URINE, CLEAN CATCH  Final   Special Requests NONE  Final   Culture  Setup Time   Final    05/26/2014 21:57 Performed at [place] Performed at [facility]   Final   Culture NO GROWTH Performed at [facility]   Final   Report Status 05/27/2014 FINAL  Final     No results found.  Assessment: Patient is a 63 year old obese male with infected bilateral plantar foot diabetic ulcerations, diabetic peripheral neuropathy, and lymphedema.   Plan: Bilateral foot dressings changed with Aquacel, dry sterile dressing, and Ace bandage.  Patient would benefit from compression stockings however he states they do not fit his legs.  Offered to apply Ace bandages however patient refused.  Continue Levaquin PO.  Discussed with PT Caren Griffins regarding postop shoe and that it may not fit the patient's feet.  Patient would benefit from nonweightbearing exercises to prevent deconditioning.  Will discuss with Dr. Caprice Beaver.  Juanita Laster 06/01/2014, 9:09 PM

## 2014-06-01 NOTE — Progress Notes (Signed)
Pt was provided with post op shoes however they are too small for him.  The hospital does not have X-Large size as he needs.  The size Large which I gave him does not extend to the end of his toes.  I had an opportunity to discuss this with the Podiatrist who said that if the post op shoe is not long enough that pt is not to wear it.  Pt wears a size 14 shoe, width is a 6E.  There are shoes available in the marketplace which are designed for patients with foot wounds.  One that I found is an [employer] [place] which has pegs comprising the insole which can be removed strategically to remove weight from the position of the wound.  The patient is concerned that his foot is so wide that the sole of the shoe won't be wide enough to encompass the plantar surface of his foot.  This will best be handled either by Dr. Caprice Beaver or the Therapists at [facility].  Bottom line is that pt's walking should be fairly minimal due to his extreme weight.  Hopefully, SNF can get him doing stationary cycling to substitute for some walking.

## 2014-06-01 NOTE — Progress Notes (Signed)
Pt's O2 saturation at rest on room air 95-96%.  After ambulating to chair with PT, pt's O2 saturation at rest on room air 94%.

## 2014-06-01 NOTE — Clinical Social Work Psychosocial (Signed)
Clinical Social Work Department BRIEF PSYCHOSOCIAL ASSESSMENT 06/01/2014  Patient:  Thomas Carey, Thomas Carey     Account Number:  [id number]     Admit date:  05/25/2014  Clinical Social Worker:  Wyatt Haste  Date/Time:  06/01/2014 02:35 PM  Referred by:  CSW  Date Referred:  06/01/2014 Referred for  SNF Placement   Other Referral:   Interview type:  Patient Other interview type:    PSYCHOSOCIAL DATA Living Status:  ALONE Admitted from facility:   Level of care:   Primary support name:  Lelan Pons Primary support relationship to patient:  S[place] Degree of support available:   supportive, but out of town    CURRENT CONCERNS Current Concerns  Post-Acute Placement   Other Concerns:    SOCIAL WORK ASSESSMENT / PLAN CSW met with pt at bedside. Pt alert and oriented and reports he has lived alone for 30 years. Pt states that he "gets around, but slowly." He feels he sits too much, spending a lot of time watching television. He describes his best support as his sister, Lelan Pons. They talk on the phone about daily, but she lives in [place] and is caring for her husband who has ALS. At baseline, pt is independent and still drives. He has been in hospital for 7 days and states that he is feeling weaker than he ever has before. PT evaluated pt and recommendation is for SNF. CSW discussed placement process and provided SNF list. Pt is open to [facility] facilities. He reports he would definitely like to return home after rehab and is hopeful that this can happen, but said he will have to take it day by day.   Assessment/plan status:  Psychosocial Support/Ongoing Assessment of Needs Other assessment/ plan:   Information/referral to community resources:   SNF list    PATIENT'S/FAMILY'S RESPONSE TO PLAN OF CARE: Pt agreeable to SNF at d/c. CSW will initiate bed search and follow up with offers.       Benay Pike, [place]

## 2014-06-01 NOTE — Clinical Social Work Placement (Signed)
Clinical Social Work Department CLINICAL SOCIAL WORK PLACEMENT NOTE 06/01/2014  Patient:  Thomas Carey, Thomas Carey  Account Number:  [id number] Admit date:  05/25/2014  Clinical Social Worker:  Benay Pike, LCSW  Date/time:  06/01/2014 02:32 PM  Clinical Social Work is seeking post-discharge placement for this patient at the following level of care:   SKILLED NURSING   (*CSW will update this form in Epic as items are completed)   06/01/2014  Patient/family provided with [place] Department of Clinical Social Work's list of facilities offering this level of care within the geographic area requested by the patient (or if unable, by the patient's family).  06/01/2014  Patient/family informed of their freedom to choose among providers that offer the needed level of care, that participate in Medicare, Medicaid or managed care program needed by the patient, have an available bed and are willing to accept the patient.  06/01/2014  Patient/family informed of [facility]' ownership interest in [facility], as well as of the fact that they are under no obligation to receive care at this facility.  PASARR submitted to EDS on 06/01/2014 PASARR number received on 06/01/2014  FL2 transmitted to all facilities in geographic area requested by pt/family on  06/01/2014 FL2 transmitted to all facilities within larger geographic area on   Patient informed that his/her managed care company has contracts with or will negotiate with  certain facilities, including the following:     Patient/family informed of bed offers received:   Patient chooses bed at  Physician recommends and patient chooses bed at    Patient to be transferred to  on   Patient to be transferred to facility by  Patient and family notified of transfer on  Name of family member notified:    The following physician request were entered in Epic:   Additional Comments:  Benay Pike, [place]

## 2014-06-01 NOTE — Progress Notes (Signed)
[facility] PROGRESS NOTE  ELENA COTHERN UQJ:[id number] DOB: March 01, 1951 DOA: 05/25/2014 PCP: Purvis Kilts, MD    Code Statu also well s: Full code Family Communication: Discussed with patient; family not available Disposition Plan: Discharge when clinically appropriate, hopefully with the next 24 hours Patient may need short-term skilled nursing facility placement, but will wait until PT evaluates him.    Consultants:  Podiatry -Dr. Caprice Beaver  Procedures:  2-D echocardiogram 12/11:Study Conclusions - Left ventricle: The cavity size was normal. Wall thickness was normal. Systolic function was normal. The estimated ejection fraction was in the range of 60% to 65%. - Aortic valve: Valve area (VTI): 4.09 cm^2. Valve area (Vmax): 3.67 cm^2. - Left atrium: The atrium was mildly dilated. - Pericardium, extracardiac: Poorly visualized. Appears to be small circumferential pericardial effusion. - Technically difficult study with suboptimal acoustic windows and imaging.  Bedside debridement of plantar ulcers, Dr. Caprice Beaver, on 05/27/14  Right IJ central line (in the ED)-discontinued on 05/28/14.    Antibiotics:  Levaquin 12/13>>  Zosyn 12/8>>12/12  Vancomycin 12/8>>12/14  HPI/Subjective: Patient has no complaints.   Objective: Filed Vitals:   06/01/14 0554  BP: 132/66  Pulse: 77  Temp: 98.4 F (36.9 C)  Resp: 20     oxygen saturation 95% on room air.  Intake/Output Summary (Last 24 hours) at 06/01/14 1125 Last data filed at 06/01/14 0914  Gross per 24 hour  Intake   1300 ml  Output   1000 ml  Net    300 ml   Filed Weights   05/27/14 0600 05/28/14 0500 05/29/14 0500  Weight: 184 kg (405 lb 10.3 oz) 187.7 kg (413 lb 12.9 oz) 185.5 kg (408 lb 15.3 oz)    Exam: Gen.: Morbidly obese 63 year old man sitting up in bed, in no acute distress. Heart: S1, S2, no murmurs rubs or gallops. Lungs: Clear anteriorly with decreased breath sounds in the  bases. Breathing is nonlabored. Abdomen: Morbidly obese, positive bowel sounds, soft, nontender, nondistended. Extremities/skin: 1 + bilateral lower extremity pitting edema; decreased overall .  Decrease in erythema of both legs from below the knees to the ankles.  Bandages on both feet, not taken off. Per exam on 05/29/14, the plantar ulcerations on both feet have been debrided of surrounding callus and dry eschar; base appears to be clean; no purulent drainage. (Dr. Arvil Persons exam from 12/13 noted.) Neurologic: He is alert and oriented 3.  Data Reviewed: Basic Metabolic Panel:  Recent Labs Lab 05/26/14 0250 05/27/14 0520 05/28/14 0416 05/30/14 0554 06/01/14 0627  NA 141 139 141 143 144  K 4.0 4.4 4.4 3.8 4.4  CL 104 104 104 101 103  CO2 24 26 28  33* 34*  GLUCOSE 141* 123* 128* 108* 116*  BUN 21 12 11 12 13   CREATININE 1.12 0.90 0.90 0.83 0.83  CALCIUM 8.0* 8.3* 8.4 9.2 9.1   Liver Function Tests:  Recent Labs Lab 05/25/14 2135 05/30/14 0554  AST 19 29  ALT 10 28  ALKPHOS 81 76  BILITOT 0.6 0.7  PROT 7.2 6.6  ALBUMIN 3.4* 2.9*   No results for input(s): LIPASE, AMYLASE in the last 168 hours. No results for input(s): AMMONIA in the last 168 hours. CBC:  Recent Labs Lab 05/25/14 2111 05/26/14 0250 05/27/14 0520 05/28/14 0416 05/30/14 0554 06/01/14 0627  WBC 15.5* 12.3* 6.4 5.8 6.8 7.5  NEUTROABS 14.8*  --   --   --   --   --   HGB 14.2 12.8* 13.3 12.0* 12.6*  12.3*  HCT 42.5 38.4* 40.8 37.6* 39.1 38.6*  MCV 96.6 97.2 99.3 100.0 98.5 98.5  PLT 170 151 125* 134* 175 214   Cardiac Enzymes:  Recent Labs Lab 05/26/14 0628  TROPONINI <0.30   BNP (last 3 results)  Recent Labs  05/26/14 0628  PROBNP 881.7*   CBG:  Recent Labs Lab 05/31/14 0738 05/31/14 1128 05/31/14 1646 05/31/14 2041 06/01/14 0734  GLUCAP 103* 129* 128* 77 113*    Recent Results (from the past 240 hour(s))  Culture, blood (routine x 2)     Status: None   Collection Time:  05/25/14  9:35 PM  Result Value Ref Range Status   Specimen Description BLOOD LEFT HAND  Final   Special Requests BOTTLES DRAWN AEROBIC AND ANAEROBIC [facility]  Final   Culture  Setup Time   Final    05/27/2014 15:03 Performed at [facility]    Culture   Final    VIRIDANS STREPTOCOCCUS Note: Gram Stain Report Called to,Read Back By and Verified With: LEE B AT [id number] 05/27/14 BY FORSYTH K Performed at [facility] Performed at [facility]    Report Status 05/29/2014 FINAL  Final  Culture, blood (routine x 2)     Status: None   Collection Time: 05/25/14  9:35 PM  Result Value Ref Range Status   Specimen Description BLOOD LEFT HAND  Final   Special Requests BOTTLES DRAWN AEROBIC AND ANAEROBIC 6CC  Final   Culture NO GROWTH 6 DAYS  Final   Report Status 05/31/2014 FINAL  Final  MRSA PCR Screening     Status: None   Collection Time: 05/26/14  2:00 AM  Result Value Ref Range Status   MRSA by PCR NEGATIVE NEGATIVE Final    Comment:        The GeneXpert MRSA Assay (FDA approved for NASAL specimens only), is one component of a comprehensive MRSA colonization surveillance program. It is not intended to diagnose MRSA infection nor to guide or monitor treatment for MRSA infections.   Urine culture     Status: None   Collection Time: 05/26/14  3:18 AM  Result Value Ref Range Status   Specimen Description URINE, CLEAN CATCH  Final   Special Requests NONE  Final   Culture  Setup Time   Final    05/26/2014 21:57 Performed at [place] Performed at [facility]   Final   Culture NO GROWTH Performed at [facility]   Final   Report Status 05/27/2014 FINAL  Final     Studies: No results found.  Scheduled Meds: . atorvastatin  40 mg Oral q1800  . enoxaparin (LOVENOX) injection  80 mg Subcutaneous Q24H  . furosemide  20 mg Oral Daily  . levalbuterol  0.63 mg Nebulization Q8H  . levofloxacin  750 mg Oral  Daily  . lisinopril  5 mg Oral Daily  . polyethylene glycol  17 g Oral Daily  . potassium chloride  30 mEq Oral BID   Continuous Infusions: . sodium chloride 30 mL/hr at 05/30/14 1120   Assessment and plan:  Principal Problem:   Septic shock Active Problems:   Bilateral cellulitis of lower leg   Diabetic foot ulcers   Acute respiratory failure with hypoxia   Gram-positive cocci bacteremia   Lymphedema of both lower extremities   DM (diabetes mellitus), type 2 with peripheral vascular complications   Obesity   Sleep apnea   Sinus  tachycardia   Poor dentition   1.Septic shock with gram-positive bacteremia. 1 of 2 cultures growing out strep viridans.  Resolved. Etiology appears to be cellulitis and/or bilateral foot ulcers.  Status post Levophed and more than 5 L of IV fluid resuscitation. His blood pressure is with much improvement  better and Levophed was titrated off on 12/8.  One out of 2 blood cultures positive for Strep Viridans.  Influenza panel negative. Follow-up UA with no significant WBCs or bacteria.   He is afebrile and his white blood cell count is within normal limits. We'll discontinue vancomycin and continue Levaquin. Zosyn discontinued on 12/12.  Bilateral cellulitis of lower legs with bilateral lower extremity edema left greater than right. Clinical improvement with less erythema and edema of both legs. Bilateral lower extremity venous Dopplers were negative for DVT. We'll continue antibiotics as above.   Bilateral plantar metatarsal ulcers. Plain x-rays revealed no obvious osteoporosis. MRI  of his left foot was ordered to rule out osteomyelitis, but the patient could not fit into the MRI scanner.  Podiatrist Dr. Caprice Beaver was consulted and opined that his suspicion for osteomyelitis was low. He did a bedside debridement of both of the plantar ulcers on 05/27/14. Per my conversation with Dr. Caprice Beaver and our examination on 12/11, the ulcers look good; no  purulent drainage. We'll continue to monitor. Continue antibiotics and wound care recommendations per Dr. Caprice Beaver. Per Dr. Caprice Beaver, dressings will need to be changed every other day using Aquacel. Postoperative shoes ordered. We'll order PT for partial weight bearing with postoperative shoe and walker. Patient will be followed by Dr. Caprice Beaver in his office on 06/09/2014 at 10:00 AM.  Cardiac arrhythmias with transient  atrial fibrillation with RVR and PVCs . His heart rate has improved and the elevation may been secondary to fever and mild volume depletion.  EKG on 12 /9 suggest atrial fibrillation. EKG on 12/11 revealed sinus rhythm with PVCs and a heart rate of 62 bpm.   2-D echocardiogram reveals an ejection fraction of 60-65%. TSH/free T4 were within normal limits. Continue aspirin.  Patient denies chest pain.  Hypertension. His blood pressure is better. Continue lisiniopril and restart hydrochlorothiazide upon discharge.  Obstructive sleep apnea. We'll continue BiPAP/CPAP as tolerated.  Acute respiratory failure with hypoxia. Not sure what his underlying baseline oxygen status was, but his PO2 was in the mid 60s on room air following admission. It improved with nasal cannula oxygen. Recheck of his oxygen saturation on room air today on 06/01/14 was within normal limits at 94-95%. We'll discontinue supplemental oxygen. Query obesity hypoventilation syndrome. Because of the mild wheezes, Xopenex nebulizer was ordered. Given Lasix IV 24 hours for for scrotal edema; now resolved.  Chest x-ray on 12/9 not suggestive of edema, but with recent volume resuscitation, he may have mild vascular congestion. Continue incentive spirometry for atelectasis. Oral Lasix restarted.  Diabetes mellitus, type 2. Will continue sliding scale NovoLog. Hemoglobin A1c was 6.3.  Poor dentition.  The patient needs to see a dentist or oral surgeon given his multiple cavities in the outpatient  setting.  Thrombocytopenia. Now resolved. The patient's platelet count was within normal limits on admission, but had decreased. This could have been secondary to hemodilution from volume resuscitation and/or antibiotics and/or infection.   Probable physical deconditioning. PT consultation is pending. Anticipate that the patient will need short-term skilled nursing facility placement for reconditioning. He is not opposed to this if needed.      Time spent: 25 minutes.  [facility] Pager [phone]. If 7PM-7AM, please contact night-coverage at [email or web address], password [facility] 06/01/2014, 11:25 AM  LOS: 7 days

## 2014-06-02 ENCOUNTER — Encounter (HOSPITAL_COMMUNITY): Payer: Self-pay | Admitting: Internal Medicine

## 2014-06-02 ENCOUNTER — Inpatient Hospital Stay
Admission: RE | Admit: 2014-06-02 | Discharge: 2014-06-29 | Disposition: A | Discharge status: Home / Self Care | Payer: Medicare Other | Source: Ambulatory Visit | Attending: Internal Medicine | Admitting: Internal Medicine

## 2014-06-02 DIAGNOSIS — R059 Cough, unspecified: Principal | ICD-10-CM

## 2014-06-02 DIAGNOSIS — R05 Cough: Principal | ICD-10-CM

## 2014-06-02 DIAGNOSIS — R0989 Other specified symptoms and signs involving the circulatory and respiratory systems: Secondary | ICD-10-CM

## 2014-06-02 LAB — GLUCOSE, CAPILLARY
Glucose-Capillary: 108 mg/dL — ABNORMAL HIGH (ref 70–99)
Glucose-Capillary: 154 mg/dL — ABNORMAL HIGH (ref 70–99)

## 2014-06-02 LAB — CREATININE, SERUM
Creatinine, Ser: 0.88 mg/dL (ref 0.50–1.35)
GFR calc Af Amer: >90 mL/min (ref >90)
GFR calc non Af Amer: 88 mL/min — ABNORMAL LOW (ref >90)

## 2014-06-02 MED ORDER — POLYETHYLENE GLYCOL 3350 17 G PO PACK: 17.0000 g | PACK | Freq: Every day | ORAL | Status: DC

## 2014-06-02 MED ORDER — LEVOFLOXACIN 750 MG PO TABS: 750.0000 mg | ORAL_TABLET | Freq: Every day | ORAL | Status: DC

## 2014-06-02 MED ORDER — LEVALBUTEROL HCL 0.63 MG/3ML IN NEBU: 0.6300 mg | INHALATION_SOLUTION | Freq: Two times a day (BID) | RESPIRATORY_TRACT | Status: DC

## 2014-06-02 MED ORDER — LISINOPRIL 10 MG PO TABS: 10.0000 mg | ORAL_TABLET | Freq: Every day | ORAL | Status: DC

## 2014-06-02 MED ORDER — FUROSEMIDE 20 MG PO TABS: 20.0000 mg | ORAL_TABLET | Freq: Every day | ORAL | Status: DC

## 2014-06-02 NOTE — Clinical Social Work Placement (Signed)
Clinical Social Work Department CLINICAL SOCIAL WORK PLACEMENT NOTE 06/02/2014  Patient:  Thomas Carey, Thomas Carey  Account Number:  [id number] Admit date:  05/25/2014  Clinical Social Worker:  Benay Pike, LCSW  Date/time:  06/01/2014 02:32 PM  Clinical Social Work is seeking post-discharge placement for this patient at the following level of care:   SKILLED NURSING   (*CSW will update this form in Epic as items are completed)   06/01/2014  Patient/family provided with [place] Department of Clinical Social Work's list of facilities offering this level of care within the geographic area requested by the patient (or if unable, by the patient's family).  06/01/2014  Patient/family informed of their freedom to choose among providers that offer the needed level of care, that participate in Medicare, Medicaid or managed care program needed by the patient, have an available bed and are willing to accept the patient.  06/01/2014  Patient/family informed of [facility]' ownership interest in [facility], as well as of the fact that they are under no obligation to receive care at this facility.  PASARR submitted to EDS on 06/01/2014 PASARR number received on 06/01/2014  FL2 transmitted to all facilities in geographic area requested by pt/family on  06/01/2014 FL2 transmitted to all facilities within larger geographic area on   Patient informed that his/her managed care company has contracts with or will negotiate with  certain facilities, including the following:     Patient/family informed of bed offers received:  06/02/2014 Patient chooses bed at [facility] Physician recommends and patient chooses bed at  [facility]  Patient to be transferred to [facility] on  06/02/2014 Patient to be transferred to facility by RN Patient and family notified of transfer on 06/02/2014 Name of family member notified:  pt states he wants to call sister and does not need  CSW to  The following physician request were entered in Epic:   Additional Comments:  Benay Pike, [place]

## 2014-06-02 NOTE — Evaluation (Addendum)
Occupational Therapy Evaluation Patient Details Name: Thomas Carey MRN: [id number] DOB: 08/24/1950 Today's Date: 06/02/2014    History of Present Illness 63 yo male h/o dm, diabetic foot ulcers, htn comes in with one day of rigors /chills occurred earlier today that prompted him to come to the ED.  Says he felt awful.  Was in his usual state of health when this suddenly came on.  Denies any recent illnesses.  He has been fighting foot ulcers for many years, but has not seen a podiatrist about it for 2 years.  Wounds started developing about a year ago and progressivly gotten worse.  He has chronic lympedema to ble, this is actually better than it usually is.  His left foot is normally more swollen than his right and this is also no different than usual.  Denies any new redness or problems with his legs.  Denies any cough.  No n/v/d.  No chest pain or abd pain.  No dysuria.  No recent hospitalizations.  No resp symptoms.  He has received 5 liters ivf and tyenlol in the ED and says he feels much better.  On arrival he was hypotensive with a temp of 104.   Clinical Impression   Pt is presenting to acute OT with above situation.  He has generalized weakness throughout BUE and increased edema in B hands.  Pt fatigue easily when engaging in ADL tasks.  Pt was independent in all ADL tasks prior to admission to hospital, and wished to return to indpendence.  Pt is open to SNT OT services to regain strength and activity tolerance to return to independence.  Recommend SNF at this time.    Follow Up Recommendations  SNF    Equipment Recommendations  Other (comment) (Defer to SNF)    Recommendations for Other Services       Precautions / Restrictions Precautions Precautions: Fall Restrictions RLE Weight Bearing: Partial weight bearing RLE Partial Weight Bearing Percentage or Pounds: pt has a wound at distal head of the 1st metatarsal for which weight bearing should be decreased.Marland KitchenMarland KitchenPodiatrist has  requested that pt use a post op shoe LLE Weight Bearing: Partial weight bearing LLE Partial Weight Bearing Percentage or Pounds: pt has a wound at distal head of 1st metatarsal for which weight bearing should be decreased.Marland KitchenMarland KitchenPodiatrist has requested that pt use a post op shoe      Mobility Bed Mobility                  Transfers                      Balance                                            ADL Overall ADL's : Needs assistance/impaired                                       General ADL Comments: Pt will requrie increased assist with all standing/transfer activities.  Pt is PWB on BLE.  Pt has overall decreased activity tolerance.     Vision                     Perception     Praxis  Pertinent Vitals/Pain Pain Assessment: No/denies pain     Hand Dominance     Extremity/Trunk Assessment Upper Extremity Assessment Upper Extremity Assessment: Generalized weakness;Overall [facility] for tasks assessed (Decreased strength, grip strength, and activity tolerance using BUE.  pt also presenting with increased edema in B hands and forearms.)   Lower Extremity Assessment Lower Extremity Assessment: Defer to PT evaluation       Communication Communication Communication: HOH   Cognition Arousal/Alertness: Awake/alert Behavior During Therapy: WFL for tasks assessed/performed                       General Comments       Exercises       Shoulder Instructions      Home Living Family/patient expects to be discharged to:: Skilled nursing facility Living Arrangements: Alone                                      Prior Functioning/Environment Level of Independence: Independent             OT Diagnosis: Generalized weakness   OT Problem List: Decreased strength;Increased edema;Decreased activity tolerance   OT Treatment/Interventions: Self-care/ADL training;Therapeutic  exercise;Patient/family education;Energy conservation;DME and/or AE instruction;Therapeutic activities    OT Goals(Current goals can be found in the care plan section) Acute Rehab OT Goals Patient Stated Goal: return to independence at home OT Goal Formulation: With patient Time For Goal Achievement: 06/16/14 Potential to Achieve Goals: Good ADL Goals Pt Will Transfer to Toilet: with min guard assist Pt/caregiver will Perform Home Exercise Program: Increased strength;Both right and left upper extremity  OT Frequency: Min 2X/week   Barriers to D/C:            Co-evaluation              End of Session    Activity Tolerance: Patient tolerated treatment well Patient left: in bed;with call bell/phone within reach   Time: [id number] OT Time Calculation (min): 11 min Charges:  OT General Charges $OT Visit: 1 Procedure OT Evaluation $Initial OT Evaluation Tier I: 1 Procedure G-Codes:     Bea Graff Shalissa Easterwood, MS, OTR/L [place] [phone] 06/02/2014, 10:00 AM

## 2014-06-02 NOTE — Progress Notes (Signed)
Report given to [facility] on [place] at the [facility] [place].

## 2014-06-02 NOTE — Progress Notes (Signed)
Podiatry Progress Note  Subjective: Thomas Carey is a 63 year old male who is being followed for ulcerations of both feet.  He denies any new foot complaints.   Past Medical History  Diagnosis Date  . Obesity   . Sleep apnea   . HTN (hypertension)   . Lymphedema of both lower extremities 05/30/2014  . Diabetic foot ulcers 05/27/2014  . DM (diabetes mellitus), type 2 with peripheral vascular complications   . Septic shock 05/26/2014    ? sec to BLE cellulitis/foot ulcers   Scheduled Meds: . atorvastatin  40 mg Oral q1800  . enoxaparin (LOVENOX) injection  80 mg Subcutaneous Q24H  . furosemide  20 mg Oral Daily  . levalbuterol  0.63 mg Nebulization Q8H  . levofloxacin  750 mg Oral Daily  . lisinopril  5 mg Oral Daily  . polyethylene glycol  17 g Oral Daily   Continuous Infusions: . sodium chloride 30 mL/hr at 06/01/14 1607   PRN Meds:.acetaminophen **OR** acetaminophen, bisacodyl, LORazepam, ondansetron **OR** ondansetron (ZOFRAN) IV  No Known Allergies History reviewed. No pertinent past surgical history. Family History  Problem Relation Age of Onset  . Heart disease Brother    Social History:  reports that he has never smoked. He does not have any smokeless tobacco history on file. He reports that he does not drink alcohol or use illicit drugs.  Physical Examination: Vital signs in last 24 hours:   Temp:  [98.2 F (36.8 C)-98.8 F (37.1 C)] 98.2 F (36.8 C) (12/15 0619) Pulse Rate:  [58-91] 64 (12/15 0619) Resp:  [17-20] 20 (12/15 0619) BP: (112-171)/(53-75) 112/53 mmHg (12/15 0619) SpO2:  [88 %-95 %] 93 % (12/15 0749) Weight:  [387 lb 11.2 oz (175.86 kg)] 387 lb 11.2 oz (175.86 kg) (12/15 1610)  Left foot:  Dressing is in place.  Skin is warm.  Skin of the lower leg is indurated.  There is a full thickness ulceration present along the plantar aspect of the first metatarsal head.  The ulceration is stable.  Dorsalis pedis pulse is palpable.  I wasn't able to palpate a  posterior tibial pulse secondary to edema.  There is some edema of the lower extremity.  Light touch sensation is diminished.  Right foot:  Dressing is in place.  Skin is warm.  Skin of the lower leg is indurated.  There is a full thickness ulceration present along the plantar aspect of the first metatarsal head.  The ulceration is stable.  Dorsalis pedis pulse is palpable.  I wasn't able to palpate a posterior tibial pulse secondary to edema.  There is some edema of the lower extremity.  Light touch sensation is diminished.   Lab/Test Results:    Recent Labs  06/01/14 0627 06/02/14 0553  WBC 7.5  --   HGB 12.3*  --   HCT 38.6*  --   PLT 214  --   NA 144  --   K 4.4  --   CL 103  --   CO2 34*  --   BUN 13  --   CREATININE 0.83 0.88  GLUCOSE 116*  --   CALCIUM 9.1  --     Recent Results (from the past 240 hour(s))  Culture, blood (routine x 2)     Status: None   Collection Time: 05/25/14  9:35 PM  Result Value Ref Range Status   Specimen Description BLOOD LEFT HAND  Final   Special Requests BOTTLES DRAWN AEROBIC AND ANAEROBIC [place]  Final  Culture  Setup Time   Final    05/27/2014 15:03 Performed at [facility]    Culture   Final    VIRIDANS STREPTOCOCCUS Note: Gram Stain Report Called to,Read Back By and Verified With: LEE B AT [id number] 05/27/14 BY FORSYTH K Performed at [facility] Performed at [facility]    Report Status 05/29/2014 FINAL  Final  Culture, blood (routine x 2)     Status: None   Collection Time: 05/25/14  9:35 PM  Result Value Ref Range Status   Specimen Description BLOOD LEFT HAND  Final   Special Requests BOTTLES DRAWN AEROBIC AND ANAEROBIC 6CC  Final   Culture NO GROWTH 6 DAYS  Final   Report Status 05/31/2014 FINAL  Final  MRSA PCR Screening     Status: None   Collection Time: 05/26/14  2:00 AM  Result Value Ref Range Status   MRSA by PCR NEGATIVE NEGATIVE Final    Comment:        The GeneXpert MRSA Assay (FDA approved for  NASAL specimens only), is one component of a comprehensive MRSA colonization surveillance program. It is not intended to diagnose MRSA infection nor to guide or monitor treatment for MRSA infections.   Urine culture     Status: None   Collection Time: 05/26/14  3:18 AM  Result Value Ref Range Status   Specimen Description URINE, CLEAN CATCH  Final   Special Requests NONE  Final   Culture  Setup Time   Final    05/26/2014 21:57 Performed at [place] Performed at [facility]   Final   Culture NO GROWTH Performed at [facility]   Final   Report Status 05/27/2014 FINAL  Final     No results found.  Assessment: 1.  Cellulitis. 2.  Diabetic ulceration of the left foot. 3.  Diabetic ulceration of the right foot. 4.  Diabetes mellitus with peripheral neuropathy. 5.  Lymphedema, both lower extremitites.    Plan: An Aquacel dressing was applied to both feet.  Antibiotics per primary team.  He is PWB on both lower extremities with postoperative shoe and use of walker.  Upon discharge, his dressings will need to be changed QOD using Aquacel. He is to follow-up with me at my private office on 06/09/2014 at 10:00 AM.  Long-term he would benefit from diabetic shoes with a soft upper to accommodate for his pedal edema and inserts to accommodate for the ulcerations.  He would also benefit from routine diabetic foot care.  I will address both of these recommendations at his office visit.     Thomas Carey 06/02/2014, 1:41 PM

## 2014-06-02 NOTE — Discharge Summary (Signed)
Physician Discharge Summary  KAILER HEINDEL WER:[id number] DOB: 05/30/1951 DOA: 05/25/2014  PCP: Purvis Kilts, MD  Admit date: 05/25/2014 Discharge date: 06/02/2014  Time spent: GREATER THAN 30 minutes  Recommendations for Outpatient Follow-up:  1. Recommend calling orthotist at [facility] in [place] to order a good fitting shoe with an insert that would remove pressure from the wounds on the plantar surfaces. Phone number is [phone]. 2. Apply Aquacel dressing to both plantar surfaces and secure with Kerlix; change dressings every other day. Keep both legs elevated at rest. 3. Partial weightbearing primarily on the lateral aspects of both feet. 4. Patient should follow-up with Dr. Caprice Beaver on 06/09/14 as scheduled. 5. Recommend checking the patient's blood sugars once or twice daily. 6. Patient is being discharged to the [facility].   Discharge Diagnoses:  1. Septic shock with gram-positive bacteremia (1 out of 2 cultures positive for strep viridans); presumed to be secondary to cellulitis and plantar ulcers. 2. Bilateral cellulitis of lower legs with chronic bilateral lower extremity lymphedema. 3. Bilateral diabetic plantar metatarsal ulcers. 4. Cardiac arrhythmias with transient atrial fibrillation with RVR and PVCs. Resolved. 5. Acute respiratory failure with hypoxia, likely secondary to sepsis. Resolved. 6. Obstructive sleep apnea, on nightly CPAP. 7. Type 2 diabetes mellitus with peripheral vascular disease. 8. Hypertension. 9. Poor dentition. Outpatient dental appointment recommended. 10. Morbid obesity.  Discharge Condition: Improved.  Diet recommendation: Carbohydrate modified/heart healthy.  Filed Weights   05/28/14 0500 05/29/14 0500 06/02/14 0619  Weight: 187.7 kg (413 lb 12.9 oz) 185.5 kg (408 lb 15.3 oz) 175.86 kg (387 lb 11.2 oz)    History of present illness:  The patient is a 63 year old man with a history significant for morbid obesity, diabetic  foot ulcers, lower extremity lymphedema, and obstructive sleep apnea, who presented to the emergency department on 03/25/14 with rigors and generalized weakness. In the ED, he was hypotensive with a systolic blood pressure in the 80s. He was febrile with a temperature of 102.9. His urinalysis revealed no WBCs or bacteria. His chest x-ray revealed no infiltrate. His ABG revealed a pH of 7.48, PCO2 of 32.4, and PO2 of 64.2. He was admitted for further evaluation and management.  Hospital Course:   1.Septic shock with gram-positive bacteremia. 1 of 2 cultures growing out strep viridans.  The patient was admitted to the ICU. He was given vigorous volume resuscitation. Pressor support was required with Levophed for approximately 24 hours before it was tapered off. Broad-spectrum antibiotic therapy was started with vancomycin and Zosyn. Blood cultures were ordered. One out of 2 blood cultures grew out strep viridans. Etiology was likely cellulitis and/or bilateral foot ulcers..  Chest x-ray revealed no pneumonia. Influenza panel negative. Follow-up UA with no significant WBCs or bacteria.  He became afebrile and hemodynamically stable. Zosyn and Vanco were eventually discontinued. He was started on Levaquin. He received a total of 9 days of antibiotic therapy during hospital course. He was discharged on 5 more days of oral Levaquin.  Bilateral cellulitis of lower legs with bilateral lower extremity lymphedema left greater than right. Treated with antibiotics as stated above. Per podiatry, the patient probably has chronic lower extremity lymphedema. Compressive therapy was recommended, but the patient refused due to the size of his legs. At the time of discharge, there was still some lower extremity erythema, but it was significantly less. Bilateral lower extremity venous Dopplers were negative for DVT.    Bilateral plantar metatarsal ulcers. Plain x-rays revealed no obvious osteoporosis. MRI of his  left foot was ordered to rule out osteomyelitis, but the patient could not fit into the MRI scanner.  Podiatrist Dr. Caprice Beaver was consulted and opined that his suspicion for osteomyelitis was low. He did a bedside debridement of both of the plantar ulcers on 05/27/14. Per Dr. Caprice Beaver, dressings will need to be changed every other day using Aquacel. Postoperative shoes ordered, but they did not fit. The physical therapist evaluated the patient and contacted the orthotist at [facility] in [place]. See information dictated above regarding calling biotech for obtaining good fitting shoes. For activity, he should be partial weightbearing using the lateral aspects of his feet. Patient will be followed by Dr. Caprice Beaver in his office on 06/09/2014 at 10:00 AM.  Cardiac arrhythmias with transient atrial fibrillation with RVR and PVCs . The patient had transient atrial fibrillation with RVR and PVCs. This was thought to be secondary to sepsis and volume depletion. Once his acute illness had resolved, he remained in normal sinus rhythm. 2-D echocardiogram revealed an ejection fraction of 60-65%. TSH/free T4 were within normal limits. Continued on aspirin.  Patient denied chest pain.  Hypertension. His blood pressure was low on admission from sepsis. Following volume resuscitation and clinical improvement, his blood pressure improved. Lisinopril was restarted at a lower dose of 10 mg daily. Hydrochlorothiazide will remain held. Continue Lasix for chronic treatment of lymphedema.  Obstructive sleep apnea. Treated with CPAP.  Acute respiratory failure with hypoxia. Not sure what his underlying baseline oxygen status was, but his PO2 was in the mid 60s on room air following admission. It improved with nasal cannula oxygen. Recheck of his oxygen saturation on room air today on 06/02/14 was within normal limits at 94-95%. Hypoxia was likely secondary to sepsis. Lower extremity venous ultrasound Dopplers  were negative for DVT. Query obesity hypoventilation syndrome. Because of the mild wheezes, Xopenex nebulizer was ordered. Given Lasix IV 24 hours for for scrotal edema; now resolved. Chest x-ray on 12/9 not suggestive of edema, but with recent volume resuscitation, he may have mild vascular congestion. Continue incentive spirometry for atelectasis. Oral Lasix restarted.  Diabetes mellitus, type 2. He was treated with sliding scale NovoLog during hospitalization. It was discontinued upon discharge and he was restarted on metformin. Hemoglobin A1c was 6.3.  Poor dentition.  The patient needs to see a dentist or oral surgeon in the outpatient setting  given his multiple cavities.  Thrombocytopenia. The patient's platelet count was within normal limits on admission, but had decreased. This could have been secondary to hemodilution from volume resuscitation and/or antibiotics and/or infection.  Resolved.   Physical deconditioning. He will be discharged to the [facility] for [facility]. Activity limitations as dictated above.    Procedures:  2-D echocardiogram 12/11:Study Conclusions - Left ventricle: The cavity size was normal. Wall thickness was normal. Systolic function was normal. The estimated ejection fraction was in the range of 60% to 65%. - Aortic valve: Valve area (VTI): 4.09 cm^2. Valve area (Vmax): 3.67 cm^2. - Left atrium: The atrium was mildly dilated. - Pericardium, extracardiac: Poorly visualized. Appears to be small circumferential pericardial effusion. - Technically difficult study with suboptimal acoustic windows and imaging.  Bedside debridement of plantar ulcers, Dr. Caprice Beaver, on 05/27/14  Right IJ central line (in the ED)-discontinued on 05/28/14.  Consultations:  Podiatry, Dr. Caprice Beaver  Discharge Exam: Filed Vitals:   06/02/14 0619  BP: 112/53  Pulse: 64  Temp: 98.2 F (36.8 C)  Resp: 20    Gen.: Morbidly obese 63 year old man  sitting  up in bed, in no acute distress. Heart: S1, S2, no murmurs rubs or gallops. Lungs: Clear anteriorly with decreased breath sounds in the bases. Breathing is nonlabored. Abdomen: Morbidly obese, positive bowel sounds, soft, nontender, nondistended. Extremities/skin: 1 + bilateral lower extremity pitting edema; decreased overall . Persistent but decrease in overall erythema of both legs from below the knees to the ankles. Bandages on both feet, not taken off. Previous exam revealed the plantar ulcerations on both feet have been debrided of surrounding callus and dry eschar; base appears to be clean; no purulent drainage. Neurologic: He is alert and oriented 3.  Discharge Instructions You were cared for by a hospitalist during your hospital stay. If you have any questions about your discharge medications or the care you received while you were in the hospital after you are discharged, you can call the unit and asked to speak with the hospitalist on call if the hospitalist that took care of you is not available. Once you are discharged, your primary care physician will handle any further medical issues. Please note that NO REFILLS for any discharge medications will be authorized once you are discharged, as it is imperative that you return to your primary care physician (or establish a relationship with a primary care physician if you do not have one) for your aftercare needs so that they can reassess your need for medications and monitor your lab values.  Discharge Instructions    Change dressing (specify)    Complete by:  As directed   Apply Aquacel dressing to plantar surfaces both feet and secure with Kerlix.     Diet - low sodium heart healthy    Complete by:  As directed      Discharge instructions    Complete by:  As directed   Partial weightbearing with ambulation on the lateral sides of his feet; assisted with rolling walker.     Increase activity slowly    Complete by:  As directed            Current Discharge Medication List    START taking these medications   Details  levalbuterol (XOPENEX) 0.63 MG/3ML nebulizer solution Take 3 mLs (0.63 mg total) by nebulization 2 (two) times daily.    levofloxacin (LEVAQUIN) 750 MG tablet Take 1 tablet (750 mg total) by mouth daily. Daily for 5 more days.    lisinopril (PRINIVIL) 10 MG tablet Take 1 tablet (10 mg total) by mouth daily.    polyethylene glycol (MIRALAX / GLYCOLAX) packet Take 17 g by mouth daily.      CONTINUE these medications which have CHANGED   Details  furosemide (LASIX) 20 MG tablet Take 1 tablet (20 mg total) by mouth daily. *May take one additional tablet as needed for fluid retention      CONTINUE these medications which have NOT CHANGED   Details  aspirin EC 81 MG tablet Take 81 mg by mouth every evening.    atorvastatin (LIPITOR) 40 MG tablet Take 40 mg by mouth daily.    gemfibrozil (LOPID) 600 MG tablet Take 600 mg by mouth 2 (two) times daily before a meal.    metFORMIN (GLUCOPHAGE) 500 MG tablet Take 250 mg by mouth 2 (two) times daily with a meal.     potassium chloride SA (K-DUR,KLOR-CON) 20 MEQ tablet Take 20 mEq by mouth 2 (two) times daily.      STOP taking these medications     lisinopril-hydrochlorothiazide (PRINZIDE,ZESTORETIC) 20-12.5 MG per tablet  No Known Allergies Follow-up Information    Follow up with Marcheta Grammes, DPM.   Specialty:  Podiatry   Contact information:   [address] [place] [id number] [phone]       Follow up On 06/09/2014.   Why:  AT 10 AM       The results of significant diagnostics from this hospitalization (including imaging, microbiology, ancillary and laboratory) are listed below for reference.    Significant Diagnostic Studies: US Venous Img Lower Bilateral  05/27/2014   CLINICAL DATA:  Edema in both lower extremities with cellulitis. Patient complains of pain. Color changes and ulcerations.  EXAM:  BILATERAL LOWER EXTREMITY VENOUS DOPPLER ULTRASOUND  TECHNIQUE: Gray-scale sonography with graded compression, as well as color Doppler and duplex ultrasound were performed to evaluate the lower extremity deep venous systems from the level of the common femoral vein and including the common femoral, femoral, profunda femoral, popliteal and calf veins including the posterior tibial, peroneal and gastrocnemius veins when visible. The superficial great saphenous vein was also interrogated. Spectral Doppler was utilized to evaluate flow at rest and with distal augmentation maneuvers in the common femoral, femoral and popliteal veins.  COMPARISON:  None.  FINDINGS: RIGHT LOWER EXTREMITY  Normal compressibility, augmentation and color Doppler flow in the right common femoral vein, right femoral vein and right popliteal vein. Limited evaluation of the deep calf veins. The distal great saphenous vein is compressible and patent.  LEFT LOWER EXTREMITY  Normal compressibility, augmentation and color Doppler flow in the left common femoral vein, left femoral vein and left popliteal vein. Limited evaluation of the left deep calf veins. The distal left great saphenous vein is patent.  Other Findings: There is soft tissue edema, particularly in the left calf.  IMPRESSION: No evidence of deep venous thrombosis.   Electronically Signed   By: Markus Daft M.D.   On: 05/27/2014 16:56   Dg Chest Port 1 View  05/27/2014   CLINICAL DATA:  Fever and chills.  EXAM: PORTABLE CHEST - 1 VIEW  COMPARISON:  05/26/2014.  FINDINGS: Right IJ line in good anatomic position. Cardiomegaly with normal pulmonary vascularity. Poor inspiration basilar atelectasis. Developing infiltrate left lung base cannot be excluded. No pleural effusion or pneumothorax.  IMPRESSION: 1. Cardiomegaly.  Pulmonary vascularity normal. 2. Low lung volumes with basilar atelectasis. Developing mild infiltrate left lung base cannot be excluded.   Electronically Signed   By:  Marcello Moores  Register   On: 05/27/2014 07:01   Dg Chest Port 1 View  05/26/2014   CLINICAL DATA:  Fever, chills, difficulty breathing.  EXAM: PORTABLE CHEST - 1 VIEW  COMPARISON:  05/26/2014  FINDINGS: No change in of location of central venous catheter. Shallow inspiration. Cardiac enlargement. Pulmonary vascularity is probably normal for technique. No focal consolidation in the lungs. No blunting of costophrenic angles. No pneumothorax.  IMPRESSION: Shallow inspiration. Cardiac enlargement. No evidence of active pulmonary disease.   Electronically Signed   By: Lucienne Capers M.D.   On: 05/26/2014 05:52   Dg Chest Port 1 View  05/26/2014   CLINICAL DATA:  Central line placement. Line placed in the right side of the neck. Chills, weakness, and shortness of breath for 1 day.  EXAM: PORTABLE CHEST - 1 VIEW  COMPARISON:  05/25/2014  FINDINGS: Interval placement of right central venous catheter. Tip overlies the low SVC region. No pneumothorax. Shallow inspiration. Mild cardiac enlargement. No focal airspace disease or consolidation in the lungs. No blunting of costophrenic angles.  IMPRESSION: No active disease.   Electronically Signed   By: Lucienne Capers M.D.   On: 05/26/2014 00:43   Dg Chest Port 1 View  05/25/2014   CLINICAL DATA:  Chills, weakness, and shortness of breath for 1 day.  EXAM: PORTABLE CHEST - 1 VIEW  COMPARISON:  10/05/2006  FINDINGS: Shallow inspiration. The heart size and mediastinal contours are within normal limits. Both lungs are clear. The visualized skeletal structures are unremarkable.  IMPRESSION: No active disease.   Electronically Signed   By: Lucienne Capers M.D.   On: 05/25/2014 21:30   Dg Foot 2 Views Left  05/26/2014   CLINICAL DATA:  Swelling, redness, question osteomyelitis, staph infection  EXAM: LEFT FOOT - 2 VIEW  COMPARISON:  None.  FINDINGS: Two views of the left foot submitted. No acute fracture or subluxation. Significant soft tissue swelling dorsal foot and around  the ankle. Plantar spurring of calcaneus. No periosteal reaction or bony erosion to suggest osteomyelitis.  IMPRESSION: No acute fracture or subluxation. No definite evidence of osteomyelitis. Significant soft tissue swelling. Plantar spurring of calcaneus pre   Electronically Signed   By: Lahoma Crocker M.D.   On: 05/26/2014 11:44   Dg Foot 2 Views Right  05/26/2014   CLINICAL DATA:  Swelling, redness, diabetes, osteomyelitis, checking for staph infection  EXAM: RIGHT FOOT - 2 VIEW  COMPARISON:  04/19/2010  FINDINGS: Of the right foot submitted. No acute fracture or subluxation. No bone destruction to suggest osteomyelitis. There is plantar spur of calcaneus. Soft tissue swelling noted dorsal metatarsal region and anterior plantar aspect. There is soft tissue ulceration anterior plantar spurring.  IMPRESSION: No acute fracture or subluxation. No definite evidence of osteomyelitis. Plantar spurring of calcaneus. Soft tissue swelling dorsal metatarsal region and anterior plantar region. There is soft tissue ulceration anterior plantar region.   Electronically Signed   By: Lahoma Crocker M.D.   On: 05/26/2014 11:46    Microbiology: Recent Results (from the past 240 hour(s))  Culture, blood (routine x 2)     Status: None   Collection Time: 05/25/14  9:35 PM  Result Value Ref Range Status   Specimen Description BLOOD LEFT HAND  Final   Special Requests BOTTLES DRAWN AEROBIC AND ANAEROBIC [facility]  Final   Culture  Setup Time   Final    05/27/2014 15:03 Performed at [facility]    Culture   Final    VIRIDANS STREPTOCOCCUS Note: Gram Stain Report Called to,Read Back By and Verified With: LEE B AT [id number] 05/27/14 BY FORSYTH K Performed at [facility] Performed at [facility]    Report Status 05/29/2014 FINAL  Final  Culture, blood (routine x 2)     Status: None   Collection Time: 05/25/14  9:35 PM  Result Value Ref Range Status   Specimen Description BLOOD LEFT HAND  Final   Special  Requests BOTTLES DRAWN AEROBIC AND ANAEROBIC 6CC  Final   Culture NO GROWTH 6 DAYS  Final   Report Status 05/31/2014 FINAL  Final  MRSA PCR Screening     Status: None   Collection Time: 05/26/14  2:00 AM  Result Value Ref Range Status   MRSA by PCR NEGATIVE NEGATIVE Final    Comment:        The GeneXpert MRSA Assay (FDA approved for NASAL specimens only), is one component of a comprehensive MRSA colonization surveillance program. It is not intended to diagnose MRSA infection nor to guide or monitor treatment  for MRSA infections.   Urine culture     Status: None   Collection Time: 05/26/14  3:18 AM  Result Value Ref Range Status   Specimen Description URINE, CLEAN CATCH  Final   Special Requests NONE  Final   Culture  Setup Time   Final    05/26/2014 21:57 Performed at [place] Performed at [facility]   Final   Culture NO GROWTH Performed at [facility]   Final   Report Status 05/27/2014 FINAL  Final     Labs: Basic Metabolic Panel:  Recent Labs Lab 05/27/14 0520 05/28/14 0416 05/30/14 0554 06/01/14 0627 06/02/14 0553  NA 139 141 143 144  --   K 4.4 4.4 3.8 4.4  --   CL 104 104 101 103  --   CO2 26 28 33* 34*  --   GLUCOSE 123* 128* 108* 116*  --   BUN 12 11 12 13   --   CREATININE 0.90 0.90 0.83 0.83 0.88  CALCIUM 8.3* 8.4 9.2 9.1  --    Liver Function Tests:  Recent Labs Lab 05/30/14 0554  AST 29  ALT 28  ALKPHOS 76  BILITOT 0.7  PROT 6.6  ALBUMIN 2.9*   No results for input(s): LIPASE, AMYLASE in the last 168 hours. No results for input(s): AMMONIA in the last 168 hours. CBC:  Recent Labs Lab 05/27/14 0520 05/28/14 0416 05/30/14 0554 06/01/14 0627  WBC 6.4 5.8 6.8 7.5  HGB 13.3 12.0* 12.6* 12.3*  HCT 40.8 37.6* 39.1 38.6*  MCV 99.3 100.0 98.5 98.5  PLT 125* 134* 175 214   Cardiac Enzymes: No results for input(s): CKTOTAL, CKMB, CKMBINDEX, TROPONINI in the last 168  hours. BNP: BNP (last 3 results)  Recent Labs  05/26/14 0628  PROBNP 881.7*   CBG:  Recent Labs Lab 06/01/14 1148 06/01/14 1655 06/01/14 2045 06/02/14 0718 06/02/14 1143  GLUCAP 132* 99 98 108* 154*       Signed:  Avalynne Diver  [facility] 06/02/2014, 1:49 PM

## 2014-06-02 NOTE — Progress Notes (Signed)
NURSING PROGRESS NOTE  Thomas Carey [id number] Discharge Data: 06/02/2014 6:12 PM Attending Provider: No att. providers found [name], [name], MD   [name] to be D/C'd Skilled nursing facility per MD order.    All IV's discontinued and monitored for bleeding.  All belongings returned to patient for patient to take home.  AVS summary included in packet. Report given to Rn at [facility]  Patient left floor via wheelchair, escorted by NT.  Last Documented Vital Signs:  Blood pressure 122/58, pulse 70, temperature 98.4 F (36.9 C), temperature source Oral, resp. rate 20, height 6' (1.829 m), weight 175.86 kg (387 lb 11.2 oz), SpO2 90 %.  [name] D

## 2014-06-02 NOTE — Clinical Social Work Note (Signed)
CSW presented bed offers and pt accepts [facility]. D/C today. Pt and facility aware and agreeable. CSW asked pt if it was okay to contact family and he states that he wants to call his sister. Pt to transfer with RN. D/C summary to be faxed upon completion.  Benay Pike, [place]

## 2014-06-02 NOTE — Progress Notes (Signed)
Physical Therapy Treatment Patient Details Name: Thomas Carey MRN: [id number] DOB: 07/24/1950 Today's Date: 06/02/2014    History of Present Illness 63 yo male h/o dm, diabetic foot ulcers, htn comes in with one day of rigors /chills occurred earlier today that prompted him to come to the ED.  Says he felt awful.  Was in his usual state of health when this suddenly came on.  Denies any recent illnesses.  He has been fighting foot ulcers for many years, but has not seen a podiatrist about it for 2 years.  Wounds started developing about a year ago and progressivly gotten worse.  He has chronic lympedema to ble, this is actually better than it usually is.  His left foot is normally more swollen than his right and this is also no different than usual.  Denies any new redness or problems with his legs.  Denies any cough.  No n/v/d.  No chest pain or abd pain.  No dysuria.  No recent hospitalizations.  No resp symptoms.  He has received 5 liters ivf and tyenlol in the ED and says he feels much better.  On arrival he was hypotensive with a temp of 104.    PT Comments    Pt was seen for therapeutic exercise to LEs while in the bed.  He tolerated this well 10-20 repetitions on all exercise.  He was somewhat dyspneic even at rest.  O2 sat =92% on room air.  He requested that he have supplemental O2 during PT for comfort and this was provided.  O2 sat on 2 L O2 was 95%.  His transfer ability was much less labored today and he was able to transfer to edge of bed and then to standing with only standby assistance.  Since he does not currently have any appropriate shoes, I have instructed him in weight bearing on primarily the lateral aspect of both feet.  I also did not progress him to any gait so as not to cause deterioration of his foot wounds.  I have spoken with an Customer service manager at [employer] in [place] about this pt's situation.  They would be able to work with him at SNF to obtain a good fitting shoe with an  insert that would remove pressure from the wounds on the plantar surfaces.  Their phone # is [phone].  Follow Up Recommendations  SNF     Equipment Recommendations  Rolling walker with 5" wheels (bariatric)    Recommendations for Other Services  OT     Precautions / Restrictions Precautions Precautions: Fall Restrictions Weight Bearing Restrictions: Yes RLE Weight Bearing: Partial weight bearing RLE Partial Weight Bearing Percentage or Pounds: pt has a wound at distal head of the 1st metatarsal for which weight bearing should be decreased.Marland KitchenMarland KitchenPodiatrist has requested that pt use a post op shoe LLE Weight Bearing: Partial weight bearing LLE Partial Weight Bearing Percentage or Pounds: pt has a wound at distal head of 1st metatarsal for which weight bearing should be decreased.Marland KitchenMarland KitchenPodiatrist has requested that pt use a post op shoe    Mobility  Bed Mobility Overal bed mobility: Modified Independent             General bed mobility comments: HOB elevated  Transfers Overall transfer level: Needs assistance Equipment used: Rolling walker (2 wheeled) Transfers: Sit to/from [facility] Sit to Stand: Supervision Stand pivot transfers: Supervision (using a bariatric walker)       General transfer comment: transfer to standing was much less labored today  and he was able to stand on the 1st try...he was unable to wear post op shoes as they are too small for him so pt was again reminded to bear weight primarily on the latter aspect of both feet  Ambulation/Gait                 Stairs            Wheelchair Mobility    Modified Rankin (Stroke Patients Only)       Balance Overall balance assessment: Modified Independent                                  Cognition Arousal/Alertness: Awake/alert Behavior During Therapy: WFL for tasks assessed/performed Overall Cognitive Status: Within Functional Limits for tasks assessed                       Exercises General Exercises - Lower Extremity Quad Sets: AROM;Both;10 reps;Supine Gluteal Sets: AROM;Both;10 reps;Supine Short Arc Quad: AROM;Both;20 reps;Supine Heel Slides: AROM;Both;10 reps;Supine;Strengthening Hip ABduction/ADduction: AROM;Strengthening;Both;10 reps;Supine Straight Leg Raises: AROM;Both;10 reps;Supine    General Comments        Pertinent Vitals/Pain Pain Assessment: No/denies pain    Home Living Family/patient expects to be discharged to:: Skilled nursing facility Living Arrangements: Alone                  Prior Function Level of Independence: Independent          PT Goals (current goals can now be found in the care plan section) Acute Rehab PT Goals Patient Stated Goal: return to independence at home Progress towards PT goals: Progressing toward goals    Frequency  Min 3X/week    PT Plan Current plan remains appropriate    Co-evaluation             End of Session   Activity Tolerance: Patient tolerated treatment well Patient left: in chair;with call bell/phone within reach;with chair alarm set     Time: 1027-1059 PT Time Calculation (min) (ACUTE ONLY): 32 min  Charges:  $Therapeutic Exercise: 8-22 mins $Therapeutic Activity: 8-22 mins                    G Codes:      Sable Feil 18-Jun-2014, 1:02 PM

## 2014-06-02 NOTE — Progress Notes (Signed)
UR chart review completed.  

## 2014-06-03 ENCOUNTER — Non-Acute Institutional Stay (SKILLED_NURSING_FACILITY): Payer: Medicare Other | Admitting: Internal Medicine

## 2014-06-03 DIAGNOSIS — A408 Other streptococcal sepsis: Secondary | ICD-10-CM

## 2014-06-03 DIAGNOSIS — L97519 Non-pressure chronic ulcer of other part of right foot with unspecified severity: Secondary | ICD-10-CM

## 2014-06-03 DIAGNOSIS — B954 Other streptococcus as the cause of diseases classified elsewhere: Secondary | ICD-10-CM

## 2014-06-03 DIAGNOSIS — L97529 Non-pressure chronic ulcer of other part of left foot with unspecified severity: Secondary | ICD-10-CM

## 2014-06-03 DIAGNOSIS — E11621 Type 2 diabetes mellitus with foot ulcer: Secondary | ICD-10-CM

## 2014-06-03 DIAGNOSIS — A491 Streptococcal infection, unspecified site: Secondary | ICD-10-CM

## 2014-06-03 DIAGNOSIS — R609 Edema, unspecified: Secondary | ICD-10-CM

## 2014-06-03 LAB — GLUCOSE, CAPILLARY
GLUCOSE-CAPILLARY: 103 mg/dL — AB (ref 70–99)
Glucose-Capillary: 129 mg/dL — ABNORMAL HIGH (ref 70–99)

## 2014-06-04 LAB — GLUCOSE, CAPILLARY
Glucose-Capillary: 121 mg/dL — ABNORMAL HIGH (ref 70–99)
Glucose-Capillary: 118 mg/dL — ABNORMAL HIGH (ref 70–99)
Glucose-Capillary: 163 mg/dL — ABNORMAL HIGH (ref 70–99)

## 2014-06-05 LAB — GLUCOSE, CAPILLARY
GLUCOSE-CAPILLARY: 113 mg/dL — AB (ref 70–99)
GLUCOSE-CAPILLARY: 131 mg/dL — AB (ref 70–99)
Glucose-Capillary: 90 mg/dL (ref 70–99)
Glucose-Capillary: 106 mg/dL — ABNORMAL HIGH (ref 70–99)

## 2014-06-06 LAB — GLUCOSE, CAPILLARY
Glucose-Capillary: 110 mg/dL — ABNORMAL HIGH (ref 70–99)
Glucose-Capillary: 160 mg/dL — ABNORMAL HIGH (ref 70–99)

## 2014-06-07 LAB — GLUCOSE, CAPILLARY: Glucose-Capillary: 138 mg/dL — ABNORMAL HIGH (ref 70–99)

## 2014-06-07 NOTE — Progress Notes (Signed)
Patient ID: Thomas Carey, male   DOB: 1951-04-18, 63 y.o.   MRN: [id number]               HISTORY & PHYSICAL  DATE:  06/03/2014    FACILITY: [facility]    LEVEL OF CARE:   SNF   CHIEF COMPLAINT:  Status post admission to [facility], 05/25/2014 through 06/02/2014.  This is an admission to SNF.    HISTORY OF PRESENT ILLNESS:  This is a 63 year-old man who was admitted to hospital with fever, rigors, and generalized weakness.  He was hypotensive with a systolic blood pressure in the 80s.  His temperature was 102.9.  His urinalysis was negative.   Chest x-ray negative.  Arterial blood gas was 7.48, pCO2 of 32.4, pO2 of 64.2.  One out of two of his blood cultures grew Strep viridans.  He was given vigorous volume resuscitation, pressor support.  Required Levophed for approximately 24 hours.  He was initially given broad spectrum antibiotics, including vancomycin and Zosyn.  He was ultimately discharged on Levaquin, which he completed during the hospital course of nine days, and has a further five days.  The source of this was felt to be bilateral cellulitis of the lower legs.    He has chronic wounds on the plantar aspects of his first metatarsals.  He tells me that the one on the right has been there for as long as 10 years, closing on and off.  Plain x-ray did not show osteomyelitis.  He apparently could not fit into the MRI scanner.  He has been followed by Podiatry, Dr. Caprice Beaver.  He apparently had debridement of both ulcers on 05/27/2014.  He is using Aquacel.    Other issues in the hospital included chronic arrhythmia with atrial fibrillation.  This was felt to be secondary to sepsis.  His thyroid function tests were normal.  He was put on aspirin.  His lisinopril was ultimately restarted once his blood pressure stabilized.    PAST MEDICAL HISTORY/PROBLEM LIST:             Septic shock as described with 1/2 blood cultures growing Strep viridans.    Bilateral cellulitis of his  lower legs?  This is an unusual phenomenon.  I think he probably has bilateral severe venous stasis with chronic inflammatory stasis dermatitis.  Whether he had cellulitis or not, I am not sure.    Bilateral plantar first metatarsal ulcers, a more likely source.    Probable diabetic neuropathy and PAD.    Obstructive sleep apnea.  Compliant with his nightly CPAP.  ?Obesity hypoventilation.    Hypertension.    History of morbid obesity.    LABS/RADIOLOGY:  Echocardiogram done during this admission showed a normal left ventricular EF.  His right ventricle was poorly visualized; however, mildly enlarged.   CURRENT MEDICATIONS:  Discharge medications include:     Xopenex nebulizers two times daily.    Levaquin 750 for five days.    Lisinopril 10 q.d.    MiraLAX 17 g q.d.    Lasix 20 q.d., with 1 additional tablet as needed for "fluid retention".    ASA 81 q.d.    Lipitor 40 q.d.    Lopid 600 b.i.d.    Metformin 250 b.i.d.    Potassium 20 mEq b.i.d.    He was taken off his Zestoretic.    SOCIAL HISTORY:    HOUSING:  The patient tells me that he lives in [place] on his  own.   FUNCTIONAL STATUS:  He has no history of ambulatory assist devices.  He does not use diabetic footwear, but tells me that he has been on total contact casts, etc., in the past.  He is independent with ADLs and IADLs.  Tells me he has no cardiopulmonary limitations within what he does on an average daily basis.    FAMILY HISTORY:        SIBLINGS:  Heart disease in a brother.    REVIEW OF SYSTEMS:               CHEST/RESPIRATORY:  No cough.  No shortness of breath.   CARDIAC:   No clear history of chest pain or palpitations.   GI:  Probable constipation.  No abdominal pain.  States his last bowel movement may have been late last week.   GU:  No dysuria, but he has urinary frequency.   NEUROLOGICAL:   Extremities:  No sensory loss.   CIRCULATION:  No claudication that he describes.    PHYSICAL  EXAMINATION:    GENERAL APPEARANCE:  The patient is not in any distress.   CHEST/RESPIRATORY:  Very shallow air entry bilaterally, but no crackles or wheezes.  No accessory muscle use.   CARDIOVASCULAR:  CARDIAC:  Heart sounds are distant.  No murmurs are appreciated.  His JVP is borderline elevated at 45.     EDEMA/VARICOSITIES:  He has scant coccyx edema.     GASTROINTESTINAL:  ABDOMEN:   Very distended.  I did not appreciate any fluid wave in the abdomen.   HERNIA:  Small periumbilical hernia that reduces.   GENITOURINARY:  BLADDER:   Has a urinary catheter associated, suggesting some degree of frequency and urgency but no dysuria.  His bladder is not overtly enlarged.   CIRCULATION:   EDEMA/VARICOSITIES:    Probable severe bilateral venous stasis with chronic stasis dermatitis with a brawny erythema.  The diagnosis of cellulitis in this situation can be a difficult phenomenon.  However, bilateral cellulitis is unusual.  I suspect he also has some degree of lymphedema.   VASCULAR:   ARTERIAL:  Very difficult to feel any pulses in legs because of the edema in his feet.  I do not see any recent vascular arterial assessment.  However, he did have duplex ultrasounds which were negative for DVT bilaterally.   SKIN:  INSPECTION:  Over his bilateral plantar first metatarsal heads are where the problem is.  He has an open area on the plantar right side.  There is undermining here.  This bleeds easily.  There is not overt infection.  On the left side is thick callus with subdermal hemorrhage.  There is probably a wound underneath this.  I note he had debridement in the hospital which has probably callused over.   NEUROLOGICAL:    SENSATION/STRENGTH:  He has antigravity strength in both legs.   DEEP TENDON REFLEXES:  He is diffusely hyperreflexic, probably compatible with diabetic neuropathy.   BALANCE/GAIT:  I did not attempt to ambulate him.   PSYCHIATRIC:   MENTAL STATUS:   I see no overt  abnormalities here.   Mildly depressed affect.      ASSESSMENT/PLAN:                   Strep viridans sepsis.  He is completing antibiotic therapy and appears to be well.  This was felt to be secondary to cellulitis and/or plantar ulcers.    Severe bilateral venous stasis with some  degree of lymphedema.  This is chronic.  He does not wear stockings.    Bilateral diabetic plantar ulcerations.  He could not fit into the MRI to rule out osteomyelitis, in particular the right foot, I gather.  He is applying Aquacel AG to these wounds.  This seems reasonable.  The area on the left first plantar, I do not think has healed.  However, I am not planning to debride this.  I think it was recently debrided by Dr. Caprice Beaver, if I am reading the notes right.    Type 2 diabetes with diabetic neuropathy and macrovascular disease/PAD.   His hemoglobin A1c was 6.3, which does suggest very good control longitudinally.    Fluid overload.  He has significant edema, even in his arms.  I think this is probably from copious amounts of fluid resuscitation at the time of his hypotension.  I am going to put him on Lasix 40 instead of 20.  We will monitor his weights and electrolytes.    ?Constipation.  I was unable to determine if there was a fluid wave in his abdomen.  I think he will need an aggressive bowel regimen.     CPT CODE: 96222                               ADDENDUM:    I noticed that there has been some talk about diabetic footwear for this man.  He apparently has [employer].  I am uncertain whether they cover any aspect of this.  I suspect that custom-made diabetic footwear for his feet with custom insoles would cost well over $1000.   I am doubtful that he could ambulate on the lateral aspect of his feet.

## 2014-06-08 LAB — GLUCOSE, CAPILLARY
GLUCOSE-CAPILLARY: 115 mg/dL — AB (ref 70–99)
GLUCOSE-CAPILLARY: 129 mg/dL — AB (ref 70–99)
Glucose-Capillary: 100 mg/dL — ABNORMAL HIGH (ref 70–99)

## 2014-06-09 LAB — GLUCOSE, CAPILLARY
GLUCOSE-CAPILLARY: 117 mg/dL — AB (ref 70–99)
GLUCOSE-CAPILLARY: 115 mg/dL — AB (ref 70–99)
Glucose-Capillary: 122 mg/dL — ABNORMAL HIGH (ref 70–99)
Glucose-Capillary: 103 mg/dL — ABNORMAL HIGH (ref 70–99)

## 2014-06-10 LAB — GLUCOSE, CAPILLARY
GLUCOSE-CAPILLARY: 144 mg/dL — AB (ref 70–99)
Glucose-Capillary: 104 mg/dL — ABNORMAL HIGH (ref 70–99)
Glucose-Capillary: 112 mg/dL — ABNORMAL HIGH (ref 70–99)
Glucose-Capillary: 112 mg/dL — ABNORMAL HIGH (ref 70–99)

## 2014-06-11 LAB — GLUCOSE, CAPILLARY
GLUCOSE-CAPILLARY: 113 mg/dL — AB (ref 70–99)
GLUCOSE-CAPILLARY: 178 mg/dL — AB (ref 70–99)

## 2014-06-12 ENCOUNTER — Encounter: Payer: Self-pay | Admitting: Internal Medicine

## 2014-06-12 ENCOUNTER — Non-Acute Institutional Stay (SKILLED_NURSING_FACILITY): Payer: Medicare Other | Admitting: Internal Medicine

## 2014-06-12 DIAGNOSIS — R05 Cough: Secondary | ICD-10-CM

## 2014-06-12 DIAGNOSIS — I890 Lymphedema, not elsewhere classified: Secondary | ICD-10-CM

## 2014-06-12 DIAGNOSIS — R059 Cough, unspecified: Secondary | ICD-10-CM | POA: Insufficient documentation

## 2014-06-12 DIAGNOSIS — I10 Essential (primary) hypertension: Secondary | ICD-10-CM

## 2014-06-12 DIAGNOSIS — E1159 Type 2 diabetes mellitus with other circulatory complications: Secondary | ICD-10-CM

## 2014-06-12 DIAGNOSIS — E1151 Type 2 diabetes mellitus with diabetic peripheral angiopathy without gangrene: Secondary | ICD-10-CM

## 2014-06-12 LAB — GLUCOSE, CAPILLARY
GLUCOSE-CAPILLARY: 136 mg/dL — AB (ref 70–99)
Glucose-Capillary: 112 mg/dL — ABNORMAL HIGH (ref 70–99)
Glucose-Capillary: 120 mg/dL — ABNORMAL HIGH (ref 70–99)
Glucose-Capillary: 106 mg/dL — ABNORMAL HIGH (ref 70–99)

## 2014-06-12 NOTE — Progress Notes (Signed)
Patient ID: Thomas Carey, male   DOB: [birth year], 63 y.o.   MRN: [id number]   This is an acute visit.  Level care skilled.  Facility [facility].  Chief complaint acute visit secondary to cough throat discomfort.  History of present illness.  Patient is a pleasant 63 year old male who was admitted to the hospital with fever and rigors and weakness-he was hypotensive-one of 2 blood cultures grew out strep viridans-she was given vigorous volume resuscitation-treated with broad-spectrum antibiotics including vancomycin and Zosyn-ultimately discharge on Levaquin which he has completed here in the facility he has chronic wounds on the plantar aspects of the first metatarsals-he could not fit into an MRI to rule out osteomyelitis-this is being treated in the facility and has been assessed by Dr. Dellia Nims.  This is complicated with a history of severe venous stasis-as well as a history of fluid overload-Dr. Dellia Nims did increase his Lasix last week.  .  It appears she's lost about 4 pounds over the past several days.  He does not complain of any acute shortness of breath he is complaining of a cough that is not very productive-he says his throat feels irritated-he has been afebrile denies any fevers or chills-she feels sleepy respiratory issue is progressing into his chest.  He does have Xopenex nebulizers twice a day-.  Family medical social history as been reviewed per admission note on 06/03/2014.  Medications have been reviewed per MAR.  Review of systems.  In general does not complain of any fever or chills.  Skin does have significant venous stasis as well as bilateral foot wounds as noted above.  Respiratory does not complaining of acute shortness of breath to does feel he has a bothersome cough feels it's progressing into his chest.  Cardiac does not complain of chest pain again has severe venous stasis with history of fluid overload.  Musculoskeletal has lower extremity  weakness but does not specifically complain of joint pain.  Neurologic does not complain of any headache dizziness or syncopal-type feelings.  Physical exam.  Temperature is 97.9 pulse 70 respirations 16 blood pressure 129/62-137/64-the lowest one I see is 97/54 and this is rare-weight is 362.3.  General this is a pleasant morbidly obese middle-age male in no distress sitting comfortably in his chair.  His skin is warm and dry does have chronic venous stasis changes erythema of his shins bilaterally-she also has covering of his distal feet bilaterally again this is followed by wound care and Dr. Dellia Nims.  Eyes pupils appear reactive light sclera and conjunctiva are clear.  Oropharynx is clear mucous membranes moist I did not really note any exudate or increased erythema.  Chest he has shallow entry with minimal amount of expiratory wheezing --upper lung fields  No labored breathing.  Heart is regular rate and rhythm without murmur gallop or rub-he has chronic venous stasis changes edema lower legs bilaterally.  Abdomen is distended soft nontender with slightly hypoactive bowel sounds.  He does have a small periumbilical hernia.  Neurologic is grossly intact again he does have lower extremity weakness again with significant edema venous stasis changes.  Psych is alert and oriented pleasant and appropriate.  Labs.  06/01/2014.  Sodium 144 potassium 4.4 BUN 13 creatinine 0.83.  WBC 7.5 hemoglobin 12.3 platelets 214.  05/25/2014 very  At that time white count was 15.5 hemoglobin 14.2 platelets 170-liver function tests within normal limits except albumin of 3.4.  Assessment and plan.  #1-cough-we'll start Mucinex 600 milligrams twice a day for 5  days-also will obtain a chest x-ray secondary to some mild congestion noted on exam-also monitor vital signs pulse ox every shift for 72 hours.-Continues on Xopenex nebulizers twice a day as well-  #2-history of fluid retention-Dr.  Dellia Nims did increase his Lasix recently ordered a metabolic panel but I do not see those results for follow-up will obtain one tomorrow as well as a CBC-clinically this appears stable. he's lost a small amount of weight which is encouraging.  #3-do note in the hospital his hypertensives were modified secondary to low blood pressures thought secondary to sepsis-he has been restarted on low-dose lisinopril-blood pressures appear to be stable most recently 129/62 137/64-I do see 97 /54 which appears to be the lowest one I see at this point continue to monitor  #4-diabetes type 2-he is on Glucophage blood sugars I been able to assess appear to be stable in the low 100s generally-at this point monitor-again will need a metabolic panel updated.  [id number]

## 2014-06-13 ENCOUNTER — Inpatient Hospital Stay (HOSPITAL_COMMUNITY): Payer: Medicare Other | Attending: Internal Medicine

## 2014-06-13 LAB — GLUCOSE, CAPILLARY
Glucose-Capillary: 99 mg/dL (ref 70–99)
Glucose-Capillary: 110 mg/dL — ABNORMAL HIGH (ref 70–99)
Glucose-Capillary: 89 mg/dL (ref 70–99)
Glucose-Capillary: 126 mg/dL — ABNORMAL HIGH (ref 70–99)

## 2014-06-14 LAB — GLUCOSE, CAPILLARY
GLUCOSE-CAPILLARY: 109 mg/dL — AB (ref 70–99)
GLUCOSE-CAPILLARY: 103 mg/dL — AB (ref 70–99)
GLUCOSE-CAPILLARY: 130 mg/dL — AB (ref 70–99)
Glucose-Capillary: 97 mg/dL (ref 70–99)

## 2014-06-15 LAB — GLUCOSE, CAPILLARY
GLUCOSE-CAPILLARY: 113 mg/dL — AB (ref 70–99)
GLUCOSE-CAPILLARY: 107 mg/dL — AB (ref 70–99)
GLUCOSE-CAPILLARY: 123 mg/dL — AB (ref 70–99)
Glucose-Capillary: 107 mg/dL — ABNORMAL HIGH (ref 70–99)

## 2014-06-16 LAB — GLUCOSE, CAPILLARY
GLUCOSE-CAPILLARY: 111 mg/dL — AB (ref 70–99)
Glucose-Capillary: 112 mg/dL — ABNORMAL HIGH (ref 70–99)
Glucose-Capillary: 118 mg/dL — ABNORMAL HIGH (ref 70–99)

## 2014-06-17 LAB — GLUCOSE, CAPILLARY
Glucose-Capillary: 107 mg/dL — ABNORMAL HIGH (ref 70–99)
Glucose-Capillary: 105 mg/dL — ABNORMAL HIGH (ref 70–99)
Glucose-Capillary: 96 mg/dL (ref 70–99)

## 2014-06-18 LAB — GLUCOSE, CAPILLARY
GLUCOSE-CAPILLARY: 102 mg/dL — AB (ref 70–99)
GLUCOSE-CAPILLARY: 123 mg/dL — AB (ref 70–99)
GLUCOSE-CAPILLARY: 109 mg/dL — AB (ref 70–99)

## 2014-06-19 DIAGNOSIS — L03115 Cellulitis of right lower limb: Secondary | ICD-10-CM | POA: Diagnosis not present

## 2014-06-19 DIAGNOSIS — E1159 Type 2 diabetes mellitus with other circulatory complications: Secondary | ICD-10-CM | POA: Diagnosis not present

## 2014-06-19 DIAGNOSIS — E11621 Type 2 diabetes mellitus with foot ulcer: Secondary | ICD-10-CM | POA: Diagnosis not present

## 2014-06-19 DIAGNOSIS — I4891 Unspecified atrial fibrillation: Secondary | ICD-10-CM | POA: Diagnosis not present

## 2014-06-19 DIAGNOSIS — L03116 Cellulitis of left lower limb: Secondary | ICD-10-CM | POA: Diagnosis not present

## 2014-06-19 DIAGNOSIS — I10 Essential (primary) hypertension: Secondary | ICD-10-CM | POA: Diagnosis not present

## 2014-06-19 DIAGNOSIS — I739 Peripheral vascular disease, unspecified: Secondary | ICD-10-CM | POA: Diagnosis not present

## 2014-06-19 DIAGNOSIS — G4733 Obstructive sleep apnea (adult) (pediatric): Secondary | ICD-10-CM | POA: Diagnosis not present

## 2014-06-19 DIAGNOSIS — M6281 Muscle weakness (generalized): Secondary | ICD-10-CM | POA: Diagnosis not present

## 2014-06-19 DIAGNOSIS — R7881 Bacteremia: Secondary | ICD-10-CM | POA: Diagnosis not present

## 2014-06-19 DIAGNOSIS — R262 Difficulty in walking, not elsewhere classified: Secondary | ICD-10-CM | POA: Diagnosis not present

## 2014-06-19 DIAGNOSIS — J9601 Acute respiratory failure with hypoxia: Secondary | ICD-10-CM | POA: Diagnosis not present

## 2014-06-19 DIAGNOSIS — E669 Obesity, unspecified: Secondary | ICD-10-CM | POA: Diagnosis not present

## 2014-06-19 DIAGNOSIS — R278 Other lack of coordination: Secondary | ICD-10-CM | POA: Diagnosis not present

## 2014-06-19 LAB — GLUCOSE, CAPILLARY
GLUCOSE-CAPILLARY: 102 mg/dL — AB (ref 70–99)
Glucose-Capillary: 132 mg/dL — ABNORMAL HIGH (ref 70–99)
Glucose-Capillary: 99 mg/dL (ref 70–99)

## 2014-06-20 LAB — GLUCOSE, CAPILLARY: Glucose-Capillary: 109 mg/dL — ABNORMAL HIGH (ref 70–99)

## 2014-06-21 LAB — GLUCOSE, CAPILLARY
GLUCOSE-CAPILLARY: 94 mg/dL (ref 70–99)
Glucose-Capillary: 204 mg/dL — ABNORMAL HIGH (ref 70–99)

## 2014-06-22 LAB — GLUCOSE, CAPILLARY
GLUCOSE-CAPILLARY: 100 mg/dL — AB (ref 70–99)
GLUCOSE-CAPILLARY: 88 mg/dL (ref 70–99)

## 2014-06-23 LAB — GLUCOSE, CAPILLARY
GLUCOSE-CAPILLARY: 107 mg/dL — AB (ref 70–99)
Glucose-Capillary: 113 mg/dL — ABNORMAL HIGH (ref 70–99)

## 2014-06-24 LAB — GLUCOSE, CAPILLARY
GLUCOSE-CAPILLARY: 109 mg/dL — AB (ref 70–99)
Glucose-Capillary: 97 mg/dL (ref 70–99)

## 2014-06-25 LAB — GLUCOSE, CAPILLARY
GLUCOSE-CAPILLARY: 101 mg/dL — AB (ref 70–99)
Glucose-Capillary: 113 mg/dL — ABNORMAL HIGH (ref 70–99)

## 2014-06-26 ENCOUNTER — Non-Acute Institutional Stay (SKILLED_NURSING_FACILITY): Payer: Medicare Other | Admitting: Internal Medicine

## 2014-06-26 DIAGNOSIS — E1159 Type 2 diabetes mellitus with other circulatory complications: Secondary | ICD-10-CM

## 2014-06-26 DIAGNOSIS — I10 Essential (primary) hypertension: Secondary | ICD-10-CM

## 2014-06-26 DIAGNOSIS — E669 Obesity, unspecified: Secondary | ICD-10-CM | POA: Diagnosis not present

## 2014-06-26 DIAGNOSIS — J9601 Acute respiratory failure with hypoxia: Secondary | ICD-10-CM | POA: Diagnosis not present

## 2014-06-26 DIAGNOSIS — E1151 Type 2 diabetes mellitus with diabetic peripheral angiopathy without gangrene: Secondary | ICD-10-CM

## 2014-06-26 DIAGNOSIS — L03116 Cellulitis of left lower limb: Secondary | ICD-10-CM

## 2014-06-26 DIAGNOSIS — L03115 Cellulitis of right lower limb: Secondary | ICD-10-CM

## 2014-06-26 LAB — GLUCOSE, CAPILLARY
GLUCOSE-CAPILLARY: 106 mg/dL — AB (ref 70–99)
GLUCOSE-CAPILLARY: 104 mg/dL — AB (ref 70–99)

## 2014-06-26 NOTE — Progress Notes (Signed)
Patient ID: Thomas Carey, male   DOB: [birth year], 64 y.o.   MRN: [id number]  : Thomas Carey, male           This is a discharge note  Level care skilled.  Facility [facility].  Chief complaint -discharge note.  History of present illness.  Patient is a pleasant 64 year old male who was admitted to the hospital with fever and rigors and weakness-he was hypotensive-one of 2 blood cultures grew out strep viridans-he was given vigorous volume resuscitation-treated with broad-spectrum antibiotics including vancomycin and Zosyn-ultimately discharge on Levaquin which he has completed here in the facility he has chronic wounds on the plantar aspects of the first metatarsals-he could not fit into an MRI to rule out osteomyelitis-this is being treated in the facility and has been assessed by Dr. Dellia Nims.  This is complicated with a history of severe venous stasis-as well as a history of fluid overload-Dr. Dellia Nims did increase his Lasix recently   It appears he's lost about 20 pounds since his admission I suspect this is largely fluid.   . I saw him late last month for cough-he completed a short course of Mucinex-he is on Xopenex nebulizers as well-a chest x-ray did not show any acute process-he is not complaining of a cough this evening  He does have Xopenex nebulizers twice a day  Patient lives by himself he appears to be quite functional he does use a CPAP at night-.  He will need a wheelchair secondary to  partial weightbearing-.  He also has a history of diabetes he is on Glucophage 250 mg twice a day-blood sugars appear to be quite stable running quite consistently in the low 100s I see one that is 69 that is the lowest  Family medical social history as been reviewed per admission note on 06/03/2014.  Medications have been reviewed per MAR.  Review of systems.  In general does not complain of any fever or chills.  Skin does have significant venous stasis but this  appears improved from initial exam.  Respiratory does not complain of shortness of breath or cough today  Cardiac does not complain of chest pain again has venous stasis with history of fluid overload.  Musculoskeletal has lower extremity weakness but does not specifically complain of joint pain.  Neurologic does not complain of any headache dizziness or syncopal-type feelings.  Physical exam.   Temperature is 97.3 pulse 82 respirations 20 blood pressure 120/78 all this appears to be relatively stable weight is 348 which is trended down about 20 pounds since admission in mid December.  General this is a pleasant morbidly obese middle-age male in no distress sitting comfortably in his chair.  His skin is warm and dry does have chronic venous stasis changes erythema of his shins bilaterally-this is improved from when I saw him previously.  Eyes pupils appear reactive light sclera and conjunctiva are clear.  Oropharynx is clear mucous membranes moist I did not really note any exudate or increased erythema.  Chest clear to auscultation-no labored breathing-somewhat shallow air entry .  Heart is regular rate and rhythm without murmur gallop or rub-he has chronic venous stasis changes edema lower legs bilaterally.  Abdomen is distended soft nontender with slightly hypoactive bowel sounds.  He does have a small periumbilical hernia  Muscle skeletal-moves all extremities 4 has lower extremity weakness largely ambulates in a wheelchair.  Neurologic is grossly intact again he does have lower extremity weakness -.  Psych is alert and oriented  pleasant and appropriate.  Labs.  06/15/2014.  WBC 5.2 hemoglobin 13.8 platelets 231.  Sodium 136 potassium 4.1 BUN 19 creatinine 1.01 CO2 level 29  06/01/2014.  Sodium 144 potassium 4.4 BUN 13 creatinine 0.83.  WBC 7.5 hemoglobin 12.3 platelets 214.  05/25/2014 very  At that time white count was 15.5 hemoglobin 14.2 platelets  170-liver function tests within normal limits except albumin of 3.4.  Assessment and plan.   #1 history of lower extremity cellulitis-this appears to have resolved-will need follow-up by primary care provider-clinically he appears stable edema is significantly improved-he is on a higher dose of Lasix will need to check metabolic panel before discharge he is on potassium supplementation as well -  #2-history of fluid retention As noted above he is now on Lasix 40 mg a day with potassium 20 mEq twice a day-he has lost about 20 pounds suspect this is essentially fluid  #3-do note in the hospital his hypertensives were modified secondary to low blood pressures thought secondary to sepsis-he has been restarted on low-dose lisinopril-blood pressures appear to be stable  Most recently 120/78-148/69-114/77 appears to run mainly in this range  #4-diabetes type 2-he is on Glucophage blood sugars appear to be quite stable.  #5-cough-this appears to have resolved again he is on routine nebulizers  Patient will be going home he lives by himself he continues on routine nebulizers twice a day-also uses CPAP at night.  Clinically he appears to be improved will need expedient follow-up however by healthcare provide  He will need a wheelchair for ambulation.  [id number] note greater than 30 minutes spent on this discharge r

## 2014-06-27 LAB — GLUCOSE, CAPILLARY
Glucose-Capillary: 119 mg/dL — ABNORMAL HIGH (ref 70–99)
Glucose-Capillary: 91 mg/dL (ref 70–99)

## 2014-06-28 LAB — GLUCOSE, CAPILLARY
Glucose-Capillary: 115 mg/dL — ABNORMAL HIGH (ref 70–99)
Glucose-Capillary: 99 mg/dL (ref 70–99)

## 2014-06-29 ENCOUNTER — Encounter: Payer: Self-pay | Admitting: Internal Medicine

## 2014-06-29 LAB — GLUCOSE, CAPILLARY: GLUCOSE-CAPILLARY: 107 mg/dL — AB (ref 70–99)

## 2014-06-30 DIAGNOSIS — I4891 Unspecified atrial fibrillation: Secondary | ICD-10-CM | POA: Diagnosis not present

## 2014-06-30 DIAGNOSIS — R6521 Severe sepsis with septic shock: Secondary | ICD-10-CM | POA: Diagnosis not present

## 2014-06-30 DIAGNOSIS — L97419 Non-pressure chronic ulcer of right heel and midfoot with unspecified severity: Secondary | ICD-10-CM | POA: Diagnosis not present

## 2014-06-30 DIAGNOSIS — E119 Type 2 diabetes mellitus without complications: Secondary | ICD-10-CM | POA: Diagnosis not present

## 2014-06-30 DIAGNOSIS — J9691 Respiratory failure, unspecified with hypoxia: Secondary | ICD-10-CM | POA: Diagnosis not present

## 2014-06-30 DIAGNOSIS — L0390 Cellulitis, unspecified: Secondary | ICD-10-CM | POA: Diagnosis not present

## 2014-07-01 DIAGNOSIS — R6521 Severe sepsis with septic shock: Secondary | ICD-10-CM | POA: Diagnosis not present

## 2014-07-01 DIAGNOSIS — L97419 Non-pressure chronic ulcer of right heel and midfoot with unspecified severity: Secondary | ICD-10-CM | POA: Diagnosis not present

## 2014-07-01 DIAGNOSIS — L0390 Cellulitis, unspecified: Secondary | ICD-10-CM | POA: Diagnosis not present

## 2014-07-01 DIAGNOSIS — J9691 Respiratory failure, unspecified with hypoxia: Secondary | ICD-10-CM | POA: Diagnosis not present

## 2014-07-01 DIAGNOSIS — E119 Type 2 diabetes mellitus without complications: Secondary | ICD-10-CM | POA: Diagnosis not present

## 2014-07-01 DIAGNOSIS — I4891 Unspecified atrial fibrillation: Secondary | ICD-10-CM | POA: Diagnosis not present

## 2014-07-02 DIAGNOSIS — I4891 Unspecified atrial fibrillation: Secondary | ICD-10-CM | POA: Diagnosis not present

## 2014-07-02 DIAGNOSIS — L97419 Non-pressure chronic ulcer of right heel and midfoot with unspecified severity: Secondary | ICD-10-CM | POA: Diagnosis not present

## 2014-07-02 DIAGNOSIS — R6521 Severe sepsis with septic shock: Secondary | ICD-10-CM | POA: Diagnosis not present

## 2014-07-02 DIAGNOSIS — L0390 Cellulitis, unspecified: Secondary | ICD-10-CM | POA: Diagnosis not present

## 2014-07-02 DIAGNOSIS — J9691 Respiratory failure, unspecified with hypoxia: Secondary | ICD-10-CM | POA: Diagnosis not present

## 2014-07-02 DIAGNOSIS — E119 Type 2 diabetes mellitus without complications: Secondary | ICD-10-CM | POA: Diagnosis not present

## 2014-07-06 DIAGNOSIS — I4891 Unspecified atrial fibrillation: Secondary | ICD-10-CM | POA: Diagnosis not present

## 2014-07-06 DIAGNOSIS — R6521 Severe sepsis with septic shock: Secondary | ICD-10-CM | POA: Diagnosis not present

## 2014-07-06 DIAGNOSIS — L97419 Non-pressure chronic ulcer of right heel and midfoot with unspecified severity: Secondary | ICD-10-CM | POA: Diagnosis not present

## 2014-07-06 DIAGNOSIS — J9691 Respiratory failure, unspecified with hypoxia: Secondary | ICD-10-CM | POA: Diagnosis not present

## 2014-07-06 DIAGNOSIS — L0390 Cellulitis, unspecified: Secondary | ICD-10-CM | POA: Diagnosis not present

## 2014-07-06 DIAGNOSIS — E119 Type 2 diabetes mellitus without complications: Secondary | ICD-10-CM | POA: Diagnosis not present

## 2014-07-14 DIAGNOSIS — L97521 Non-pressure chronic ulcer of other part of left foot limited to breakdown of skin: Secondary | ICD-10-CM | POA: Diagnosis not present

## 2014-07-14 DIAGNOSIS — E1342 Other specified diabetes mellitus with diabetic polyneuropathy: Secondary | ICD-10-CM | POA: Diagnosis not present

## 2014-07-17 DIAGNOSIS — J9691 Respiratory failure, unspecified with hypoxia: Secondary | ICD-10-CM | POA: Diagnosis not present

## 2014-07-17 DIAGNOSIS — I4891 Unspecified atrial fibrillation: Secondary | ICD-10-CM | POA: Diagnosis not present

## 2014-07-17 DIAGNOSIS — E119 Type 2 diabetes mellitus without complications: Secondary | ICD-10-CM | POA: Diagnosis not present

## 2014-07-17 DIAGNOSIS — L0390 Cellulitis, unspecified: Secondary | ICD-10-CM | POA: Diagnosis not present

## 2014-07-17 DIAGNOSIS — L97419 Non-pressure chronic ulcer of right heel and midfoot with unspecified severity: Secondary | ICD-10-CM | POA: Diagnosis not present

## 2014-07-17 DIAGNOSIS — R6521 Severe sepsis with septic shock: Secondary | ICD-10-CM | POA: Diagnosis not present

## 2014-07-20 DIAGNOSIS — Z23 Encounter for immunization: Secondary | ICD-10-CM | POA: Diagnosis not present

## 2014-08-17 ENCOUNTER — Telehealth: Payer: Self-pay

## 2014-08-17 NOTE — Telephone Encounter (Signed)
PATIENT RECEIVED LETTER TO SCHEDULE COLONOSCOPY PLEASE CALL

## 2014-08-18 NOTE — Telephone Encounter (Signed)
Called, VM not set up.  

## 2014-08-18 NOTE — Telephone Encounter (Signed)
Pt called to schedule colonoscopy. Thomas Carey he has had blood in his stool all of his life when he is constipated which is frequently. OV with Neil Crouch, PA on 09/01/2014 at 1:30 PM.

## 2014-09-01 ENCOUNTER — Encounter: Payer: Self-pay | Admitting: Gastroenterology

## 2014-09-01 ENCOUNTER — Ambulatory Visit (INDEPENDENT_AMBULATORY_CARE_PROVIDER_SITE_OTHER): Payer: Medicare Other | Admitting: Gastroenterology

## 2014-09-01 ENCOUNTER — Other Ambulatory Visit: Payer: Self-pay

## 2014-09-01 ENCOUNTER — Encounter (INDEPENDENT_AMBULATORY_CARE_PROVIDER_SITE_OTHER): Payer: Self-pay

## 2014-09-01 VITALS — BP 127/73 | HR 66 | Temp 97.8°F | Ht 72.0 in | Wt 361.4 lb

## 2014-09-01 DIAGNOSIS — Z800 Family history of malignant neoplasm of digestive organs: Secondary | ICD-10-CM

## 2014-09-01 DIAGNOSIS — K625 Hemorrhage of anus and rectum: Secondary | ICD-10-CM

## 2014-09-01 MED ORDER — PEG-KCL-NACL-NASULF-NA ASC-C 100 G PO SOLR: 1.0000 | ORAL | Status: DC

## 2014-09-01 NOTE — Patient Instructions (Signed)
1. Colonoscopy as scheduled. See separate instructions.  
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
## 2014-09-05 NOTE — Assessment & Plan Note (Signed)
64 y/o male with chronic intermittent rectal bleeding, FH of CRC 1st degree relative at advanced age who presents for first ever colonoscopy. Patient with h/o sleep apnea, endo to be notified.  I have discussed the risks, alternatives, benefits with regards to but not limited to the risk of reaction to medication, bleeding, infection, perforation and the patient is agreeable to proceed. Written consent to be obtained.

## 2014-09-08 NOTE — Progress Notes (Signed)
cc'ed to pcp °

## 2014-09-28 ENCOUNTER — Encounter (HOSPITAL_COMMUNITY)
Admission: RE | Disposition: A | Discharge status: Home / Self Care | Payer: Self-pay | Source: Ambulatory Visit | Attending: Internal Medicine

## 2014-09-28 ENCOUNTER — Encounter (HOSPITAL_COMMUNITY): Payer: Self-pay | Admitting: *Deleted

## 2014-09-28 ENCOUNTER — Ambulatory Visit (HOSPITAL_COMMUNITY)
Admission: RE | Admit: 2014-09-28 | Discharge: 2014-09-28 | Disposition: A | Discharge status: Home / Self Care | Payer: Medicare Other | Source: Ambulatory Visit | Attending: Internal Medicine | Admitting: Internal Medicine

## 2014-09-28 DIAGNOSIS — Z7982 Long term (current) use of aspirin: Secondary | ICD-10-CM | POA: Insufficient documentation

## 2014-09-28 DIAGNOSIS — Z800 Family history of malignant neoplasm of digestive organs: Secondary | ICD-10-CM | POA: Diagnosis not present

## 2014-09-28 DIAGNOSIS — E11621 Type 2 diabetes mellitus with foot ulcer: Secondary | ICD-10-CM | POA: Insufficient documentation

## 2014-09-28 DIAGNOSIS — K5731 Diverticulosis of large intestine without perforation or abscess with bleeding: Secondary | ICD-10-CM | POA: Insufficient documentation

## 2014-09-28 DIAGNOSIS — Z860100 Personal history of colon polyps, unspecified: Secondary | ICD-10-CM | POA: Insufficient documentation

## 2014-09-28 DIAGNOSIS — G4730 Sleep apnea, unspecified: Secondary | ICD-10-CM | POA: Insufficient documentation

## 2014-09-28 DIAGNOSIS — Z79899 Other long term (current) drug therapy: Secondary | ICD-10-CM | POA: Insufficient documentation

## 2014-09-28 DIAGNOSIS — Z6841 Body Mass Index (BMI) 40.0 and over, adult: Secondary | ICD-10-CM | POA: Diagnosis not present

## 2014-09-28 DIAGNOSIS — E1151 Type 2 diabetes mellitus with diabetic peripheral angiopathy without gangrene: Secondary | ICD-10-CM | POA: Diagnosis not present

## 2014-09-28 DIAGNOSIS — K621 Rectal polyp: Secondary | ICD-10-CM | POA: Diagnosis not present

## 2014-09-28 DIAGNOSIS — D124 Benign neoplasm of descending colon: Secondary | ICD-10-CM | POA: Insufficient documentation

## 2014-09-28 DIAGNOSIS — K625 Hemorrhage of anus and rectum: Secondary | ICD-10-CM

## 2014-09-28 DIAGNOSIS — D128 Benign neoplasm of rectum: Secondary | ICD-10-CM

## 2014-09-28 DIAGNOSIS — K5730 Diverticulosis of large intestine without perforation or abscess without bleeding: Secondary | ICD-10-CM | POA: Insufficient documentation

## 2014-09-28 DIAGNOSIS — K921 Melena: Secondary | ICD-10-CM

## 2014-09-28 DIAGNOSIS — I10 Essential (primary) hypertension: Secondary | ICD-10-CM | POA: Insufficient documentation

## 2014-09-28 DIAGNOSIS — E669 Obesity, unspecified: Secondary | ICD-10-CM | POA: Insufficient documentation

## 2014-09-28 HISTORY — PX: COLONOSCOPY: SHX5424

## 2014-09-28 LAB — GLUCOSE, CAPILLARY: Glucose-Capillary: 148 mg/dL — ABNORMAL HIGH (ref 70–99)

## 2014-09-28 SURGERY — COLONOSCOPY
Anesthesia: Moderate Sedation

## 2014-09-28 MED ORDER — MIDAZOLAM HCL 5 MG/5ML IJ SOLN
INTRAMUSCULAR | Status: AC
  Filled 2014-09-28: qty 10

## 2014-09-28 MED ORDER — ONDANSETRON HCL 4 MG/2ML IJ SOLN
INTRAMUSCULAR | Status: DC | PRN
  Administered 2014-09-28: 4 mg via INTRAVENOUS

## 2014-09-28 MED ORDER — SODIUM CHLORIDE 0.9 % IV SOLN
INTRAVENOUS | Status: DC
  Administered 2014-09-28: 09:00:00 via INTRAVENOUS

## 2014-09-28 MED ORDER — MEPERIDINE HCL 100 MG/ML IJ SOLN
INTRAMUSCULAR | Status: DC | PRN
  Administered 2014-09-28: 50 mg via INTRAVENOUS
  Administered 2014-09-28: 25 mg via INTRAVENOUS

## 2014-09-28 MED ORDER — ONDANSETRON HCL 4 MG/2ML IJ SOLN
INTRAMUSCULAR | Status: AC
  Filled 2014-09-28: qty 2

## 2014-09-28 MED ORDER — STERILE WATER FOR IRRIGATION IR SOLN
Status: DC | PRN
  Administered 2014-09-28: 09:00:00

## 2014-09-28 MED ORDER — MIDAZOLAM HCL 5 MG/5ML IJ SOLN
INTRAMUSCULAR | Status: DC | PRN
  Administered 2014-09-28: 1 mg via INTRAVENOUS
  Administered 2014-09-28: 2 mg via INTRAVENOUS
  Administered 2014-09-28: 1 mg via INTRAVENOUS

## 2014-09-28 MED ORDER — MEPERIDINE HCL 100 MG/ML IJ SOLN
INTRAMUSCULAR | Status: AC
  Filled 2014-09-28: qty 2

## 2014-09-28 NOTE — Discharge Instructions (Addendum)
Diverticulosis and polyp information provided  Begin Benefiber 2 teaspoons twice daily.  Further recommendations to follow pending   Colonoscopy Discharge Instructions  Read the instructions outlined below and refer to this sheet in the next few weeks. These discharge instructions provide you with general information on caring for yourself after you leave the hospital. Your doctor may also give you specific instructions. While your treatment has been planned according to the most current medical practices available, unavoidable complications occasionally occur. If you have any problems or questions after discharge, call Dr. Gala Romney at [phone]. ACTIVITY  You may resume your regular activity, but move at a slower pace for the next 24 hours.   Take frequent rest periods for the next 24 hours.   Walking will help get rid of the air and reduce the bloated feeling in your belly (abdomen).   No driving for 24 hours (because of the medicine (anesthesia) used during the test).    Do not sign any important legal documents or operate any machinery for 24 hours (because of the anesthesia used during the test).  NUTRITION  Drink plenty of fluids.   You may resume your normal diet as instructed by your doctor.   Begin with a light meal and progress to your normal diet. Heavy or fried foods are harder to digest and may make you feel sick to your stomach (nauseated).   Avoid alcoholic beverages for 24 hours or as instructed.  MEDICATIONS  You may resume your normal medications unless your doctor tells you otherwise.  WHAT YOU CAN EXPECT TODAY  Some feelings of bloating in the abdomen.   Passage of more gas than usual.   Spotting of blood in your stool or on the toilet paper.  IF YOU HAD POLYPS REMOVED DURING THE COLONOSCOPY:  No aspirin products for 7 days or as instructed.   No alcohol for 7 days or as instructed.   Eat a soft diet for the next 24 hours.  FINDING OUT THE RESULTS OF  YOUR TEST Not all test results are available during your visit. If your test results are not back during the visit, make an appointment with your caregiver to find out the results. Do not assume everything is normal if you have not heard from your caregiver or the medical facility. It is important for you to follow up on all of your test results.  SEEK IMMEDIATE MEDICAL ATTENTION IF:  You have more than a spotting of blood in your stool.   Your belly is swollen (abdominal distention).   You are nauseated or vomiting.   You have a temperature over 101.   You have abdominal pain or discomfort that is severe or gets worse throughout the day.    Colon Polyps Polyps are lumps of extra tissue growing inside the body. Polyps can grow in the large intestine (colon). Most colon polyps are noncancerous (benign). However, some colon polyps can become cancerous over time. Polyps that are larger than a pea may be harmful. To be safe, caregivers remove and test all polyps. CAUSES  Polyps form when mutations in the genes cause your cells to grow and divide even though no more tissue is needed. RISK FACTORS There are a number of risk factors that can increase your chances of getting colon polyps. They include:  Being older than 50 years.  Family history of colon polyps or colon cancer.  Long-term colon diseases, such as colitis or Crohn disease.  Being overweight.  Smoking.  Being inactive.  Drinking too much alcohol. SYMPTOMS  Most small polyps do not cause symptoms. If symptoms are present, they may include:  Blood in the stool. The stool may look dark red or black.  Constipation or diarrhea that lasts longer than 1 week. DIAGNOSIS People often do not know they have polyps until their caregiver finds them during a regular checkup. Your caregiver can use 4 tests to check for polyps:  Digital rectal exam. The caregiver wears gloves and feels inside the rectum. This test would find polyps  only in the rectum.  Barium enema. The caregiver puts a liquid called barium into your rectum before taking X-rays of your colon. Barium makes your colon look Thomas Carey. Polyps are dark, so they are easy to see in the X-ray pictures.  Sigmoidoscopy. A thin, flexible tube (sigmoidoscope) is placed into your rectum. The sigmoidoscope has a light and tiny camera in it. The caregiver uses the sigmoidoscope to look at the last third of your colon.  Colonoscopy. This test is like sigmoidoscopy, but the caregiver looks at the entire colon. This is the most common method for finding and removing polyps. TREATMENT  Any polyps will be removed during a sigmoidoscopy or colonoscopy. The polyps are then tested for cancer. PREVENTION  To help lower your risk of getting more colon polyps:  Eat plenty of fruits and vegetables. Avoid eating fatty foods.  Do not smoke.  Avoid drinking alcohol.  Exercise every day.  Lose weight if recommended by your caregiver.  Eat plenty of calcium and folate. Foods that are rich in calcium include milk, cheese, and broccoli. Foods that are rich in folate include chickpeas, kidney beans, and spinach. HOME CARE INSTRUCTIONS Keep all follow-up appointments as directed by your caregiver. You may need periodic exams to check for polyps. SEEK MEDICAL CARE IF: You notice bleeding during a bowel movement. Document Released: 03/01/2004 Document Revised: 08/28/2011 Document Reviewed: 08/15/2011 [facility] Patient Information 2015 [place], [place]. This information is not intended to replace advice given to you by your health care provider. Make sure you discuss any questions you have with your health care provider.    Diverticulosis Diverticulosis is the condition that develops when small pouches (diverticula) form in the wall of your colon. Your colon, or large intestine, is where water is absorbed and stool is formed. The pouches form when the inside layer of your colon pushes  through weak spots in the outer layers of your colon. CAUSES  No one knows exactly what causes diverticulosis. RISK FACTORS  Being older than 79. Your risk for this condition increases with age. Diverticulosis is rare in people younger than 40 years. By age 66, almost everyone has it.  Eating a low-fiber diet.  Being frequently constipated.  Being overweight.  Not getting enough exercise.  Smoking.  Taking over-the-counter pain medicines, like aspirin and ibuprofen. SYMPTOMS  Most people with diverticulosis do not have symptoms. DIAGNOSIS  Because diverticulosis often has no symptoms, health care providers often discover the condition during an exam for other colon problems. In many cases, a health care provider will diagnose diverticulosis while using a flexible scope to examine the colon (colonoscopy). TREATMENT  If you have never developed an infection related to diverticulosis, you may not need treatment. If you have had an infection before, treatment may include:  Eating more fruits, vegetables, and grains.  Taking a fiber supplement.  Taking a live bacteria supplement (probiotic).  Taking medicine to relax your colon. HOME CARE INSTRUCTIONS   Drink at least  6-8 glasses of water each day to prevent constipation.  Try not to strain when you have a bowel movement.  Keep all follow-up appointments. If you have had an infection before:  Increase the fiber in your diet as directed by your health care provider or dietitian.  Take a dietary fiber supplement if your health care provider approves.  Only take medicines as directed by your health care provider. SEEK MEDICAL CARE IF:   You have abdominal pain.  You have bloating.  You have cramps.  You have not gone to the bathroom in 3 days. SEEK IMMEDIATE MEDICAL CARE IF:   Your pain gets worse.  Yourbloating becomes very bad.  You have a fever or chills, and your symptoms suddenly get worse.  You begin  vomiting.  You have bowel movements that are bloody or black. MAKE SURE YOU:  Understand these instructions.  Will watch your condition.  Will get help right away if you are not doing well or get worse. Document Released: 03/02/2004 Document Revised: 06/10/2013 Document Reviewed: 04/30/2013 [facility] Patient Information 2015 [place], [place]. This information is not intended to replace advice given to you by your health care provider. Make sure you discuss any questions you have with your health care provider.

## 2014-09-28 NOTE — Op Note (Signed)
[facility] [address] [place], [address]   COLONOSCOPY PROCEDURE REPORT  PATIENT: Thomas Carey, Thomas Carey  MR#: [id number] BIRTHDATE: 1951-01-04 , 24  yrs. old GENDER: male ENDOSCOPIST: R.  Garfield Cornea, MD FACP [facility] REFERRED TD:DUKG Hilma Favors, M.D. PROCEDURE DATE:  10/17/14 PROCEDURE:   Colonoscopy, diagnostic, Colonoscopy with biopsy, and Colonoscopy with snare polypectomy INDICATIONS:Paper hematochezia. MEDICATIONS: Versed 4 mg IV and Demerol 75 mg IV in divided doses. Zofran 4 mg IV. ASA CLASS:       Class III  CONSENT: The risks, benefits, alternatives and imponderables including but not limited to bleeding, perforation as well as the possibility of a missed lesion have been reviewed.  The potential for biopsy, lesion removal, etc. have also been discussed. Questions have been answered.  All parties agreeable.  Please see the history and physical in the medical record for more information.  DESCRIPTION OF PROCEDURE:   After the risks benefits and alternatives of the procedure were thoroughly explained, informed consent was obtained.  The digital rectal exam revealed no abnormalities of the rectum.   The EC-[id number] ([id number])  endoscope was introduced through the anus and advanced to the cecum, which was identified by both the appendix and ileocecal valve. No adverse events experienced.   The quality of the prep was adequate  The instrument was then slowly withdrawn as the colon was fully examined.      COLON FINDINGS: Normal-appearing rectum aside from a single diminutive polyp in the proximal rectum at 15 cm.  Capacious redundant colon, requiring a number of maneuvers including changing of the patient's position and gentle external abdominal pressure to reach the cecum.  The patient was found to have scattered left-sided diverticula; (2) 6 mm polyps in the mid descending segment; otherwise, the remainder of the colonic mucosa appeared normal.  The  above-mentioned polyps were cold biopsy removed and hot snare removed, respectively.  Retroflexion was performed. .  Withdrawal time=15 minutes 0 seconds.  The scope was withdrawn and the procedure completed. COMPLICATIONS: There were no immediate complications.  ENDOSCOPIC IMPRESSION: Rectal and colonic polyps?"removed as described above. Colonic diverticulosis. Redundant colon.  RECOMMENDATIONS: Follow-up on pathology. Begin Benefiber 2 teaspoons twice daily.  eSigned:  R. Garfield Cornea, MD Rosalita Chessman [facility] 10-17-2014 10:09 AM   cc:  CPT CODES: ICD CODES:  The ICD and CPT codes recommended by this software are interpretations from the data that the clinical staff has captured with the software.  The verification of the translation of this report to the ICD and CPT codes and modifiers is the sole responsibility of the health care institution and practicing physician where this report was generated.  [employer] will not be held responsible for the validity of the ICD and CPT codes included on this report.  AMA assumes no liability for data contained or not contained herein. CPT is a Designer, television/film set of the [employer].  PATIENT NAME:  [name], [name] MR#: [id number]
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
## 2014-09-28 NOTE — Interval H&P Note (Signed)
History and Physical Interval Note:  09/28/2014 9:01 AM  Thomas Carey  has presented today for surgery, with the diagnosis of RECTAL BLEEDING/FAMILY HISTORY  The various methods of treatment have been discussed with the patient and family. After consideration of risks, benefits and other options for treatment, the patient has consented to  Procedure(s) with comments: COLONOSCOPY (N/A) - 930  as a surgical intervention .  The patient's history has been reviewed, patient examined, no change in status, stable for surgery.  I have reviewed the patient's chart and labs.  Questions were answered to the patient's satisfaction.     Thomas Carey  No change. Diagnostic colonoscopy per plan.The risks, benefits, limitations, alternatives and imponderables have been reviewed with the patient. Questions have been answered. All parties are agreeable.

## 2014-09-29 ENCOUNTER — Encounter (HOSPITAL_COMMUNITY): Payer: Self-pay | Admitting: Internal Medicine

## 2014-10-01 ENCOUNTER — Encounter: Payer: Self-pay | Admitting: Internal Medicine

## 2014-10-23 DIAGNOSIS — R972 Elevated prostate specific antigen [PSA]: Secondary | ICD-10-CM | POA: Diagnosis not present

## 2014-10-23 DIAGNOSIS — I10 Essential (primary) hypertension: Secondary | ICD-10-CM | POA: Diagnosis not present

## 2014-10-23 DIAGNOSIS — E119 Type 2 diabetes mellitus without complications: Secondary | ICD-10-CM | POA: Diagnosis not present

## 2014-10-23 DIAGNOSIS — Z6841 Body Mass Index (BMI) 40.0 and over, adult: Secondary | ICD-10-CM | POA: Diagnosis not present

## 2014-10-23 DIAGNOSIS — E782 Mixed hyperlipidemia: Secondary | ICD-10-CM | POA: Diagnosis not present

## 2014-12-10 DIAGNOSIS — E119 Type 2 diabetes mellitus without complications: Secondary | ICD-10-CM | POA: Diagnosis not present

## 2014-12-10 DIAGNOSIS — R609 Edema, unspecified: Secondary | ICD-10-CM | POA: Diagnosis not present

## 2014-12-10 DIAGNOSIS — I10 Essential (primary) hypertension: Secondary | ICD-10-CM | POA: Diagnosis not present

## 2014-12-10 DIAGNOSIS — L97519 Non-pressure chronic ulcer of other part of right foot with unspecified severity: Secondary | ICD-10-CM | POA: Diagnosis not present

## 2014-12-10 DIAGNOSIS — Z7982 Long term (current) use of aspirin: Secondary | ICD-10-CM | POA: Diagnosis not present

## 2014-12-10 DIAGNOSIS — I890 Lymphedema, not elsewhere classified: Secondary | ICD-10-CM | POA: Diagnosis not present

## 2014-12-10 DIAGNOSIS — L97412 Non-pressure chronic ulcer of right heel and midfoot with fat layer exposed: Secondary | ICD-10-CM | POA: Diagnosis not present

## 2014-12-10 DIAGNOSIS — E780 Pure hypercholesterolemia: Secondary | ICD-10-CM | POA: Diagnosis not present

## 2014-12-10 DIAGNOSIS — Z79899 Other long term (current) drug therapy: Secondary | ICD-10-CM | POA: Diagnosis not present

## 2014-12-10 DIAGNOSIS — E11621 Type 2 diabetes mellitus with foot ulcer: Secondary | ICD-10-CM | POA: Diagnosis not present

## 2014-12-15 DIAGNOSIS — I872 Venous insufficiency (chronic) (peripheral): Secondary | ICD-10-CM | POA: Diagnosis not present

## 2014-12-15 DIAGNOSIS — I10 Essential (primary) hypertension: Secondary | ICD-10-CM | POA: Diagnosis not present

## 2014-12-15 DIAGNOSIS — Z7982 Long term (current) use of aspirin: Secondary | ICD-10-CM | POA: Diagnosis not present

## 2014-12-15 DIAGNOSIS — Z79899 Other long term (current) drug therapy: Secondary | ICD-10-CM | POA: Diagnosis not present

## 2014-12-15 DIAGNOSIS — E780 Pure hypercholesterolemia: Secondary | ICD-10-CM | POA: Diagnosis not present

## 2014-12-15 DIAGNOSIS — E11621 Type 2 diabetes mellitus with foot ulcer: Secondary | ICD-10-CM | POA: Diagnosis not present

## 2014-12-15 DIAGNOSIS — L97412 Non-pressure chronic ulcer of right heel and midfoot with fat layer exposed: Secondary | ICD-10-CM | POA: Diagnosis not present

## 2014-12-15 DIAGNOSIS — L97519 Non-pressure chronic ulcer of other part of right foot with unspecified severity: Secondary | ICD-10-CM | POA: Diagnosis not present

## 2014-12-15 DIAGNOSIS — R609 Edema, unspecified: Secondary | ICD-10-CM | POA: Diagnosis not present

## 2015-04-20 DIAGNOSIS — Z23 Encounter for immunization: Secondary | ICD-10-CM | POA: Diagnosis not present

## 2015-08-13 DIAGNOSIS — E782 Mixed hyperlipidemia: Secondary | ICD-10-CM | POA: Diagnosis not present

## 2015-08-13 DIAGNOSIS — E119 Type 2 diabetes mellitus without complications: Secondary | ICD-10-CM | POA: Diagnosis not present

## 2015-08-13 DIAGNOSIS — I10 Essential (primary) hypertension: Secondary | ICD-10-CM | POA: Diagnosis not present

## 2015-08-13 DIAGNOSIS — Z1389 Encounter for screening for other disorder: Secondary | ICD-10-CM | POA: Diagnosis not present

## 2015-08-13 DIAGNOSIS — Z6841 Body Mass Index (BMI) 40.0 and over, adult: Secondary | ICD-10-CM | POA: Diagnosis not present

## 2015-08-24 ENCOUNTER — Ambulatory Visit (INDEPENDENT_AMBULATORY_CARE_PROVIDER_SITE_OTHER): Payer: Medicare Other | Admitting: Urology

## 2015-08-24 DIAGNOSIS — N400 Enlarged prostate without lower urinary tract symptoms: Secondary | ICD-10-CM

## 2015-08-24 DIAGNOSIS — R972 Elevated prostate specific antigen [PSA]: Secondary | ICD-10-CM

## 2015-09-23 DIAGNOSIS — R972 Elevated prostate specific antigen [PSA]: Secondary | ICD-10-CM | POA: Diagnosis not present

## 2016-02-18 DIAGNOSIS — E119 Type 2 diabetes mellitus without complications: Secondary | ICD-10-CM | POA: Diagnosis not present

## 2016-02-18 DIAGNOSIS — I10 Essential (primary) hypertension: Secondary | ICD-10-CM | POA: Diagnosis not present

## 2016-02-18 DIAGNOSIS — E782 Mixed hyperlipidemia: Secondary | ICD-10-CM | POA: Diagnosis not present

## 2016-02-18 DIAGNOSIS — E1151 Type 2 diabetes mellitus with diabetic peripheral angiopathy without gangrene: Secondary | ICD-10-CM | POA: Diagnosis not present

## 2016-02-18 DIAGNOSIS — Z1389 Encounter for screening for other disorder: Secondary | ICD-10-CM | POA: Diagnosis not present

## 2016-04-20 DIAGNOSIS — Z23 Encounter for immunization: Secondary | ICD-10-CM | POA: Diagnosis not present

## 2016-08-25 DIAGNOSIS — I10 Essential (primary) hypertension: Secondary | ICD-10-CM | POA: Diagnosis not present

## 2016-08-25 DIAGNOSIS — E1151 Type 2 diabetes mellitus with diabetic peripheral angiopathy without gangrene: Secondary | ICD-10-CM | POA: Diagnosis not present

## 2016-08-25 DIAGNOSIS — Z1389 Encounter for screening for other disorder: Secondary | ICD-10-CM | POA: Diagnosis not present

## 2016-08-25 DIAGNOSIS — E119 Type 2 diabetes mellitus without complications: Secondary | ICD-10-CM | POA: Diagnosis not present

## 2016-08-25 DIAGNOSIS — G4730 Sleep apnea, unspecified: Secondary | ICD-10-CM | POA: Diagnosis not present

## 2017-04-17 DIAGNOSIS — Z23 Encounter for immunization: Secondary | ICD-10-CM | POA: Diagnosis not present

## 2017-04-25 DIAGNOSIS — I10 Essential (primary) hypertension: Secondary | ICD-10-CM | POA: Diagnosis not present

## 2017-04-25 DIAGNOSIS — R972 Elevated prostate specific antigen [PSA]: Secondary | ICD-10-CM | POA: Diagnosis not present

## 2017-04-25 DIAGNOSIS — Z0001 Encounter for general adult medical examination with abnormal findings: Secondary | ICD-10-CM | POA: Diagnosis not present

## 2017-04-25 DIAGNOSIS — E1151 Type 2 diabetes mellitus with diabetic peripheral angiopathy without gangrene: Secondary | ICD-10-CM | POA: Diagnosis not present

## 2017-04-25 DIAGNOSIS — J9610 Chronic respiratory failure, unspecified whether with hypoxia or hypercapnia: Secondary | ICD-10-CM | POA: Diagnosis not present

## 2017-04-25 DIAGNOSIS — Z1389 Encounter for screening for other disorder: Secondary | ICD-10-CM | POA: Diagnosis not present

## 2017-08-21 ENCOUNTER — Encounter: Payer: Self-pay | Admitting: Internal Medicine

## 2018-03-13 DIAGNOSIS — Z1389 Encounter for screening for other disorder: Secondary | ICD-10-CM | POA: Diagnosis not present

## 2018-03-13 DIAGNOSIS — Z23 Encounter for immunization: Secondary | ICD-10-CM | POA: Diagnosis not present

## 2018-03-13 DIAGNOSIS — E119 Type 2 diabetes mellitus without complications: Secondary | ICD-10-CM | POA: Diagnosis not present

## 2018-03-13 DIAGNOSIS — G4730 Sleep apnea, unspecified: Secondary | ICD-10-CM | POA: Diagnosis not present

## 2018-03-13 DIAGNOSIS — E782 Mixed hyperlipidemia: Secondary | ICD-10-CM | POA: Diagnosis not present

## 2018-03-13 DIAGNOSIS — I10 Essential (primary) hypertension: Secondary | ICD-10-CM | POA: Diagnosis not present

## 2018-05-02 ENCOUNTER — Ambulatory Visit: Payer: Medicare Other

## 2018-07-16 ENCOUNTER — Ambulatory Visit: Payer: Medicare Other | Admitting: Gastroenterology

## 2018-07-16 ENCOUNTER — Other Ambulatory Visit: Payer: Self-pay | Admitting: *Deleted

## 2018-07-16 ENCOUNTER — Encounter: Payer: Self-pay | Admitting: Gastroenterology

## 2018-07-16 ENCOUNTER — Encounter: Payer: Self-pay | Admitting: *Deleted

## 2018-07-16 ENCOUNTER — Telehealth: Payer: Self-pay | Admitting: *Deleted

## 2018-07-16 VITALS — BP 122/71 | HR 64 | Temp 99.3°F | Ht 72.0 in | Wt 373.2 lb

## 2018-07-16 DIAGNOSIS — Z86010 Personal history of colonic polyps: Secondary | ICD-10-CM

## 2018-07-16 DIAGNOSIS — Z800 Family history of malignant neoplasm of digestive organs: Secondary | ICD-10-CM | POA: Diagnosis not present

## 2018-07-16 MED ORDER — PEG 3350-KCL-NA BICARB-NACL 420 G PO SOLR: 4000.0000 mL | Freq: Once | ORAL | 0 refills | Status: AC
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
## 2018-07-16 NOTE — Telephone Encounter (Signed)
Pre-op scheduled for 08/05/2018 at 1:45pm. LMOVM. Letter mailed.

## 2018-07-16 NOTE — Patient Instructions (Signed)
1. Colonoscopy as scheduled. Please see separate instructions. 

## 2018-07-16 NOTE — Assessment & Plan Note (Signed)
Very pleasant 68 year old gentleman presenting to schedule 3 year surveillance colonoscopy.  He has a personal history of colon polyps (tubular adenomas) as well as a family history of colon cancer, first-degree relative greater than age of 11.  Patient denies any bowel complaints.  He has a history of sleep apnea, uses CPAP at times.  Plan for colonoscopy in the near future with deep sedation.  I have discussed the risks, alternatives, benefits with regards to but not limited to the risk of reaction to medication, bleeding, infection, perforation and the patient is agreeable to proceed. Written consent to be obtained.
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
## 2018-07-16 NOTE — Progress Notes (Signed)
CC'D TO PCP °

## 2018-08-02 NOTE — Patient Instructions (Addendum)
   Your procedure is scheduled on: 08/12/2018  Report to [facility] at   8;00  AM.  Call this number if you have problems the morning of surgery: [phone]   Remember: Take your blood pressure medication before coming to the hospital              Follow Directions on the letter you received from Your Physician's office regarding the Bowel Prep  :   Do not wear jewelry, make-up or nail polish.    Do not bring valuables to the hospital.  Contacts, dentures or bridgework may not be worn into surgery.  .   Patients discharged the day of surgery will not be allowed to drive home.     Colonoscopy, Adult, Care After This sheet gives you information about how to care for yourself after your procedure. Your health care provider may also give you more specific instructions. If you have problems or questions, contact your health care provider. What can I expect after the procedure? After the procedure, it is common to have:  A small amount of blood in your stool for 24 hours after the procedure.  Some gas.  Mild abdominal cramping or bloating.  Follow these instructions at home: General instructions   For the first 24 hours after the procedure: ? Do not drive or use machinery. ? Do not sign important documents. ? Do not drink alcohol. ? Do your regular daily activities at a slower pace than normal. ? Eat soft, easy-to-digest foods. ? Rest often.  Take over-the-counter or prescription medicines only as told by your health care provider.  It is up to you to get the results of your procedure. Ask your health care provider, or the department performing the procedure, when your results will be ready. Relieving cramping and bloating  Try walking around when you have cramps or feel bloated.  Apply heat to your abdomen as told by your health care provider. Use a heat source that your health care provider recommends, such as a moist heat pack or a heating pad. ? Place a towel between  your skin and the heat source. ? Leave the heat on for 20-30 minutes. ? Remove the heat if your skin turns bright red. This is especially important if you are unable to feel pain, heat, or cold. You may have a greater risk of getting burned. Eating and drinking  Drink enough fluid to keep your urine clear or pale yellow.  Resume your normal diet as instructed by your health care provider. Avoid heavy or fried foods that are hard to digest.  Avoid drinking alcohol for as long as instructed by your health care provider. Contact a health care provider if:  You have blood in your stool 2-3 days after the procedure. Get help right away if:  You have more than a small spotting of blood in your stool.  You pass large blood clots in your stool.  Your abdomen is swollen.  You have nausea or vomiting.  You have a fever.  You have increasing abdominal pain that is not relieved with medicine. This information is not intended to replace advice given to you by your health care provider. Make sure you discuss any questions you have with your health care provider. Document Released: 01/18/2004 Document Revised: 02/28/2016 Document Reviewed: 08/17/2015 Elsevier Interactive Patient Education  [employer].

## 2018-08-05 ENCOUNTER — Other Ambulatory Visit: Payer: Self-pay

## 2018-08-05 ENCOUNTER — Encounter (HOSPITAL_COMMUNITY): Payer: Self-pay

## 2018-08-05 ENCOUNTER — Encounter (HOSPITAL_COMMUNITY)
Admission: RE | Admit: 2018-08-05 | Discharge: 2018-08-05 | Disposition: A | Discharge status: Home / Self Care | Payer: Medicare Other | Source: Ambulatory Visit | Attending: Internal Medicine | Admitting: Internal Medicine

## 2018-08-05 DIAGNOSIS — Z01812 Encounter for preprocedural laboratory examination: Secondary | ICD-10-CM | POA: Insufficient documentation

## 2018-08-05 LAB — BASIC METABOLIC PANEL
Anion gap: 9 (ref 5–15)
BUN: 17 mg/dL (ref 8–23)
CO2: 27 mmol/L (ref 22–32)
Calcium: 9.2 mg/dL (ref 8.9–10.3)
Chloride: 105 mmol/L (ref 98–111)
Creatinine, Ser: 0.94 mg/dL (ref 0.61–1.24)
GFR calc non Af Amer: >60 mL/min (ref >60)
Glucose, Bld: 102 mg/dL — ABNORMAL HIGH (ref 70–99)
Potassium: 3.9 mmol/L (ref 3.5–5.1)
Sodium: 141 mmol/L (ref 135–145)

## 2018-08-12 ENCOUNTER — Ambulatory Visit (HOSPITAL_COMMUNITY): Payer: Medicare Other | Admitting: Anesthesiology

## 2018-08-12 ENCOUNTER — Encounter (HOSPITAL_COMMUNITY)
Admission: RE | Disposition: A | Discharge status: Home / Self Care | Payer: Self-pay | Source: Home / Self Care | Attending: Internal Medicine

## 2018-08-12 ENCOUNTER — Ambulatory Visit (HOSPITAL_COMMUNITY)
Admission: RE | Admit: 2018-08-12 | Discharge: 2018-08-12 | Disposition: A | Discharge status: Home / Self Care | Payer: Medicare Other | Attending: Internal Medicine | Admitting: Internal Medicine

## 2018-08-12 ENCOUNTER — Other Ambulatory Visit: Payer: Self-pay

## 2018-08-12 ENCOUNTER — Encounter (HOSPITAL_COMMUNITY): Payer: Self-pay | Admitting: Anesthesiology

## 2018-08-12 DIAGNOSIS — K621 Rectal polyp: Secondary | ICD-10-CM | POA: Diagnosis not present

## 2018-08-12 DIAGNOSIS — E1151 Type 2 diabetes mellitus with diabetic peripheral angiopathy without gangrene: Secondary | ICD-10-CM | POA: Diagnosis not present

## 2018-08-12 DIAGNOSIS — Z7982 Long term (current) use of aspirin: Secondary | ICD-10-CM | POA: Diagnosis not present

## 2018-08-12 DIAGNOSIS — Z7984 Long term (current) use of oral hypoglycemic drugs: Secondary | ICD-10-CM | POA: Diagnosis not present

## 2018-08-12 DIAGNOSIS — G4730 Sleep apnea, unspecified: Secondary | ICD-10-CM | POA: Diagnosis not present

## 2018-08-12 DIAGNOSIS — D128 Benign neoplasm of rectum: Secondary | ICD-10-CM | POA: Insufficient documentation

## 2018-08-12 DIAGNOSIS — Q438 Other specified congenital malformations of intestine: Secondary | ICD-10-CM | POA: Insufficient documentation

## 2018-08-12 DIAGNOSIS — Z6841 Body Mass Index (BMI) 40.0 and over, adult: Secondary | ICD-10-CM | POA: Insufficient documentation

## 2018-08-12 DIAGNOSIS — I10 Essential (primary) hypertension: Secondary | ICD-10-CM | POA: Diagnosis not present

## 2018-08-12 DIAGNOSIS — K5730 Diverticulosis of large intestine without perforation or abscess without bleeding: Secondary | ICD-10-CM

## 2018-08-12 DIAGNOSIS — Z09 Encounter for follow-up examination after completed treatment for conditions other than malignant neoplasm: Secondary | ICD-10-CM | POA: Diagnosis present

## 2018-08-12 DIAGNOSIS — D124 Benign neoplasm of descending colon: Secondary | ICD-10-CM | POA: Diagnosis not present

## 2018-08-12 DIAGNOSIS — E669 Obesity, unspecified: Secondary | ICD-10-CM | POA: Diagnosis not present

## 2018-08-12 DIAGNOSIS — Z800 Family history of malignant neoplasm of digestive organs: Secondary | ICD-10-CM | POA: Diagnosis not present

## 2018-08-12 DIAGNOSIS — K640 First degree hemorrhoids: Secondary | ICD-10-CM | POA: Diagnosis not present

## 2018-08-12 DIAGNOSIS — Z86010 Personal history of colonic polyps: Secondary | ICD-10-CM | POA: Diagnosis not present

## 2018-08-12 DIAGNOSIS — Z79899 Other long term (current) drug therapy: Secondary | ICD-10-CM | POA: Diagnosis not present

## 2018-08-12 HISTORY — PX: POLYPECTOMY: SHX5525

## 2018-08-12 HISTORY — DX: Pure hypercholesterolemia, unspecified: E78.00

## 2018-08-12 HISTORY — PX: COLONOSCOPY WITH PROPOFOL: SHX5780

## 2018-08-12 LAB — GLUCOSE, CAPILLARY
GLUCOSE-CAPILLARY: 116 mg/dL — AB (ref 70–99)
Glucose-Capillary: 96 mg/dL (ref 70–99)

## 2018-08-12 SURGERY — COLONOSCOPY WITH PROPOFOL
Anesthesia: Monitor Anesthesia Care

## 2018-08-12 MED ORDER — PROPOFOL 10 MG/ML IV BOLUS
INTRAVENOUS | Status: DC | PRN
  Administered 2018-08-12 (×2): 20 mg via INTRAVENOUS
  Administered 2018-08-12: 40 mg via INTRAVENOUS

## 2018-08-12 MED ORDER — CHLORHEXIDINE GLUCONATE CLOTH 2 % EX PADS: 6.0000 | MEDICATED_PAD | Freq: Once | CUTANEOUS | Status: DC

## 2018-08-12 MED ORDER — PROPOFOL 500 MG/50ML IV EMUL
INTRAVENOUS | Status: DC | PRN
  Administered 2018-08-12: 200 ug/kg/min via INTRAVENOUS

## 2018-08-12 MED ORDER — LACTATED RINGERS IV SOLN
INTRAVENOUS | Status: DC
  Administered 2018-08-12: 09:00:00 via INTRAVENOUS

## 2018-08-12 NOTE — Interval H&P Note (Signed)
History and Physical Interval Note:  08/12/2018 9:57 AM  Thomas Carey  has presented today for surgery, with the diagnosis of h/o colon polyps  The various methods of treatment have been discussed with the patient and family. After consideration of risks, benefits and other options for treatment, the patient has consented to  Procedure(s) with comments: COLONOSCOPY WITH PROPOFOL (N/A) - 9:15am as a surgical intervention .  The patient's history has been reviewed, patient examined, no change in status, stable for surgery.  I have reviewed the patient's chart and labs.  Questions were answered to the patient's satisfaction.     Thomas Carey  No change.  Surveillance colonoscopy per plan.  The risks, benefits, limitations, alternatives and imponderables have been reviewed with the patient. Questions have been answered. All parties are agreeable.

## 2018-08-12 NOTE — Anesthesia Preprocedure Evaluation (Signed)
Anesthesia Evaluation  Patient identified by MRN, date of birth, ID band Patient awake    Reviewed: Allergy & Precautions, H&P , NPO status , Patient's Chart, lab work & pertinent test results, reviewed documented beta blocker date and time   Airway Mallampati: II  TM Distance: >3 FB Neck ROM: full    Dental  (+) Loose, Dental Advidsory Given, Missing, Chipped, Poor Dentition Very poor dentition:   Pulmonary sleep apnea ,    Pulmonary exam normal breath sounds clear to auscultation       Cardiovascular Exercise Tolerance: Good hypertension, negative cardio ROS   Rhythm:regular Rate:Normal     Neuro/Psych negative neurological ROS  negative psych ROS   GI/Hepatic negative GI ROS, Neg liver ROS,   Endo/Other  diabetesMorbid obesity  Renal/GU negative Renal ROS  negative genitourinary   Musculoskeletal   Abdominal   Peds  Hematology negative hematology ROS (+)   Anesthesia Other Findings   Reproductive/Obstetrics negative OB ROS                             Anesthesia Physical Anesthesia Plan  ASA: III  Anesthesia Plan: MAC   Post-op Pain Management:    Induction:   PONV Risk Score and Plan:   Airway Management Planned:   Additional Equipment:   Intra-op Plan:   Post-operative Plan:   Informed Consent: I have reviewed the patients History and Physical, chart, labs and discussed the procedure including the risks, benefits and alternatives for the proposed anesthesia with the patient or authorized representative who has indicated his/her understanding and acceptance.     Dental Advisory Given  Plan Discussed with: CRNA  Anesthesia Plan Comments:         Anesthesia Quick Evaluation

## 2018-08-12 NOTE — Discharge Instructions (Signed)
Colonoscopy Discharge Instructions  Read the instructions outlined below and refer to this sheet in the next few weeks. These discharge instructions provide you with general information on caring for yourself after you leave the hospital. Your doctor may also give you specific instructions. While your treatment has been planned according to the most current medical practices available, unavoidable complications occasionally occur. If you have any problems or questions after discharge, call Dr. Gala Romney at [phone]. ACTIVITY  You may resume your regular activity, but move at a slower pace for the next 24 hours.   Take frequent rest periods for the next 24 hours.   Walking will help get rid of the air and reduce the bloated feeling in your belly (abdomen).   No driving for 24 hours (because of the medicine (anesthesia) used during the test).    Do not sign any important legal documents or operate any machinery for 24 hours (because of the anesthesia used during the test).  NUTRITION  Drink plenty of fluids.   You may resume your normal diet as instructed by your doctor.   Begin with a light meal and progress to your normal diet. Heavy or fried foods are harder to digest and may make you feel sick to your stomach (nauseated).   Avoid alcoholic beverages for 24 hours or as instructed.  MEDICATIONS  You may resume your normal medications unless your doctor tells you otherwise.  WHAT YOU CAN EXPECT TODAY  Some feelings of bloating in the abdomen.   Passage of more gas than usual.   Spotting of blood in your stool or on the toilet paper.  IF YOU HAD POLYPS REMOVED DURING THE COLONOSCOPY:  No aspirin products for 7 days or as instructed.   No alcohol for 7 days or as instructed.   Eat a soft diet for the next 24 hours.  FINDING OUT THE RESULTS OF YOUR TEST Not all test results are available during your visit. If your test results are not back during the visit, make an appointment  with your caregiver to find out the results. Do not assume everything is normal if you have not heard from your caregiver or the medical facility. It is important for you to follow up on all of your test results.  SEEK IMMEDIATE MEDICAL ATTENTION IF:  You have more than a spotting of blood in your stool.   Your belly is swollen (abdominal distention).   You are nauseated or vomiting.   You have a temperature over 101.   You have abdominal pain or discomfort that is severe or gets worse throughout the day.    Hemorrhoid, polyp and diverticulosis information provided  Further recommendations to follow pending review of pathology report      Hemorrhoids Hemorrhoids are swollen veins in and around the rectum or anus. There are two types of hemorrhoids:  Internal hemorrhoids. These occur in the veins that are just inside the rectum. They may poke through to the outside and become irritated and painful.  External hemorrhoids. These occur in the veins that are outside the anus and can be felt as a painful swelling or hard lump near the anus. Most hemorrhoids do not cause serious problems, and they can be managed with home treatments such as diet and lifestyle changes. If home treatments do not help the symptoms, procedures can be done to shrink or remove the hemorrhoids. What are the causes? This condition is caused by increased pressure in the anal area. This pressure may result  from various things, including:  Constipation.  Straining to have a bowel movement.  Diarrhea.  Pregnancy.  Obesity.  Sitting for long periods of time.  Heavy lifting or other activity that causes you to strain.  Anal sex.  Riding a bike for a long period of time. What are the signs or symptoms? Symptoms of this condition include:  Pain.  Anal itching or irritation.  Rectal bleeding.  Leakage of stool (feces).  Anal swelling.  One or more lumps around the anus. How is this  diagnosed? This condition can often be diagnosed through a visual exam. Other exams or tests may also be done, such as:  An exam that involves feeling the rectal area with a gloved hand (digital rectal exam).  An exam of the anal canal that is done using a small tube (anoscope).  A blood test, if you have lost a significant amount of blood.  A test to look inside the colon using a flexible tube with a camera on the end (sigmoidoscopy or colonoscopy). How is this treated? This condition can usually be treated at home. However, various procedures may be done if dietary changes, lifestyle changes, and other home treatments do not help your symptoms. These procedures can help make the hemorrhoids smaller or remove them completely. Some of these procedures involve surgery, and others do not. Common procedures include:  Rubber band ligation. Rubber bands are placed at the base of the hemorrhoids to cut off their blood supply.  Sclerotherapy. Medicine is injected into the hemorrhoids to shrink them.  Infrared coagulation. A type of light energy is used to get rid of the hemorrhoids.  Hemorrhoidectomy surgery. The hemorrhoids are surgically removed, and the veins that supply them are tied off.  Stapled hemorrhoidopexy surgery. The surgeon staples the base of the hemorrhoid to the rectal wall. Follow these instructions at home: Eating and drinking   Eat foods that have a lot of fiber in them, such as whole grains, beans, nuts, fruits, and vegetables.  Ask your health care provider about taking products that have added fiber (fiber supplements).  Reduce the amount of fat in your diet. You can do this by eating low-fat dairy products, eating less red meat, and avoiding processed foods.  Drink enough fluid to keep your urine pale yellow. Managing pain and swelling   Take warm sitz baths for 20 minutes, 3-4 times a day to ease pain and discomfort. You may do this in a bathtub or using a  portable sitz bath that fits over the toilet.  If directed, apply ice to the affected area. Using ice packs between sitz baths may be helpful. ? Put ice in a plastic bag. ? Place a towel between your skin and the bag. ? Leave the ice on for 20 minutes, 2-3 times a day. General instructions  Take over-the-counter and prescription medicines only as told by your health care provider.  Use medicated creams or suppositories as told.  Get regular exercise. Ask your health care provider how much and what kind of exercise is best for you. In general, you should do moderate exercise for at least 30 minutes on most days of the week (150 minutes each week). This can include activities such as walking, biking, or yoga.  Go to the bathroom when you have the urge to have a bowel movement. Do not wait.  Avoid straining to have bowel movements.  Keep the anal area dry and clean. Use wet toilet paper or moist towelettes after a  bowel movement.  Do not sit on the toilet for long periods of time. This increases blood pooling and pain.  Keep all follow-up visits as told by your health care provider. This is important. Contact a health care provider if you have:  Increasing pain and swelling that are not controlled by treatment or medicine.  Difficulty having a bowel movement, or you are unable to have a bowel movement.  Pain or inflammation outside the area of the hemorrhoids. Get help right away if you have:  Uncontrolled bleeding from your rectum. Summary  Hemorrhoids are swollen veins in and around the rectum or anus.  Most hemorrhoids can be managed with home treatments such as diet and lifestyle changes.  Taking warm sitz baths can help ease pain and discomfort.  In severe cases, procedures or surgery can be done to shrink or remove the hemorrhoids. This information is not intended to replace advice given to you by your health care provider. Make sure you discuss any questions you have  with your health care provider. Document Released: 06/02/2000 Document Revised: 10/25/2017 Document Reviewed: 10/25/2017 Elsevier Interactive Patient Education  2019 [place].     Colon Polyps  Polyps are tissue growths inside the body. Polyps can grow in many places, including the large intestine (colon). A polyp may be a round bump or a mushroom-shaped growth. You could have one polyp or several. Most colon polyps are noncancerous (benign). However, some colon polyps can become cancerous over time. Finding and removing the polyps early can help prevent this. What are the causes? The exact cause of colon polyps is not known. What increases the risk? You are more likely to develop this condition if you:  Have a family history of colon cancer or colon polyps.  Are older than 67 or older than 45 if you are African American.  Have inflammatory bowel disease, such as ulcerative colitis or Crohn's disease.  Have certain hereditary conditions, such as: ? Familial adenomatous polyposis. ? Lynch syndrome. ? Turcot syndrome. ? Peutz-Jeghers syndrome.  Are overweight.  Smoke cigarettes.  Do not get enough exercise.  Drink too much alcohol.  Eat a diet that is high in fat and red meat and low in fiber.  Had childhood cancer that was treated with abdominal radiation. What are the signs or symptoms? Most polyps do not cause symptoms. If you have symptoms, they may include:  Blood coming from your rectum when having a bowel movement.  Blood in your stool. The stool may look dark red or black.  Abdominal pain.  A change in bowel habits, such as constipation or diarrhea. How is this diagnosed? This condition is diagnosed with a colonoscopy. This is a procedure in which a lighted, flexible scope is inserted into the anus and then passed into the colon to examine the area. Polyps are sometimes found when a colonoscopy is done as part of routine cancer screening tests. How is  this treated? Treatment for this condition involves removing any polyps that are found. Most polyps can be removed during a colonoscopy. Those polyps will then be tested for cancer. Additional treatment may be needed depending on the results of testing. Follow these instructions at home: Lifestyle  Maintain a healthy weight, or lose weight if recommended by your health care provider.  Exercise every day or as told by your health care provider.  Do not use any products that contain nicotine or tobacco, such as cigarettes and e-cigarettes. If you need help quitting, ask your health  care provider.  If you drink alcohol, limit how much you have: ? 0-1 drink a day for women. ? 0-2 drinks a day for men.  Be aware of how much alcohol is in your drink. In the [place] one drink equals one 12 oz bottle of beer (355 mL), one 5 oz glass of wine (148 mL), or one 1 oz shot of hard liquor (44 mL). Eating and drinking   Eat foods that are high in fiber, such as fruits, vegetables, and whole grains.  Eat foods that are high in calcium and vitamin D, such as milk, cheese, yogurt, eggs, liver, fish, and broccoli.  Limit foods that are high in fat, such as fried foods and desserts.  Limit the amount of red meat and processed meat you eat, such as hot dogs, sausage, bacon, and lunch meats. General instructions  Keep all follow-up visits as told by your health care provider. This is important. ? This includes having regularly scheduled colonoscopies. ? Talk to your health care provider about when you need a colonoscopy. Contact a health care provider if:  You have new or worsening bleeding during a bowel movement.  You have new or increased blood in your stool.  You have a change in bowel habits.  You lose weight for no known reason. Summary  Polyps are tissue growths inside the body. Polyps can grow in many places, including the colon.  Most colon polyps are noncancerous (benign), but some can  become cancerous over time.  This condition is diagnosed with a colonoscopy.  Treatment for this condition involves removing any polyps that are found. Most polyps can be removed during a colonoscopy. This information is not intended to replace advice given to you by your health care provider. Make sure you discuss any questions you have with your health care provider. Document Released: 03/01/2004 Document Revised: 09/20/2017 Document Reviewed: 09/20/2017 Elsevier Interactive Patient Education  2019 [employer].    Diverticulosis  Diverticulosis is a condition that develops when small pouches (diverticula) form in the wall of the large intestine (colon). The colon is where water is absorbed and stool is formed. The pouches form when the inside layer of the colon pushes through weak spots in the outer layers of the colon. You may have a few pouches or many of them. What are the causes? The cause of this condition is not known. What increases the risk? The following factors may make you more likely to develop this condition:  Being older than age 32. Your risk for this condition increases with age. Diverticulosis is rare among people younger than age 71. By age 72, many people have it.  Eating a low-fiber diet.  Having frequent constipation.  Being overweight.  Not getting enough exercise.  Smoking.  Taking over-the-counter pain medicines, like aspirin and ibuprofen.  Having a family history of diverticulosis. What are the signs or symptoms? In most people, there are no symptoms of this condition. If you do have symptoms, they may include:  Bloating.  Cramps in the abdomen.  Constipation or diarrhea.  Pain in the lower left side of the abdomen. How is this diagnosed? This condition is most often diagnosed during an exam for other colon problems. Because diverticulosis usually has no symptoms, it often cannot be diagnosed independently. This condition may be diagnosed  by:  Using a flexible scope to examine the colon (colonoscopy).  Taking an X-ray of the colon after dye has been put into the colon (barium enema).  Doing  a CT scan. How is this treated? You may not need treatment for this condition if you have never developed an infection related to diverticulosis. If you have had an infection before, treatment may include:  Eating a high-fiber diet. This may include eating more fruits, vegetables, and grains.  Taking a fiber supplement.  Taking a live bacteria supplement (probiotic).  Taking medicine to relax your colon.  Taking antibiotic medicines. Follow these instructions at home:  Drink 6-8 glasses of water or more each day to prevent constipation.  Try not to strain when you have a bowel movement.  If you have had an infection before: ? Eat more fiber as directed by your health care provider or your diet and nutrition specialist (dietitian). ? Take a fiber supplement or probiotic, if your health care provider approves.  Take over-the-counter and prescription medicines only as told by your health care provider.  If you were prescribed an antibiotic, take it as told by your health care provider. Do not stop taking the antibiotic even if you start to feel better.  Keep all follow-up visits as told by your health care provider. This is important. Contact a health care provider if:  You have pain in your abdomen.  You have bloating.  You have cramps.  You have not had a bowel movement in 3 days. Get help right away if:  Your pain gets worse.  Your bloating becomes very bad.  You have a fever or chills, and your symptoms suddenly get worse.  You vomit.  You have bowel movements that are bloody or black.  You have bleeding from your rectum. Summary  Diverticulosis is a condition that develops when small pouches (diverticula) form in the wall of the large intestine (colon).  You may have a few pouches or many of  them.  This condition is most often diagnosed during an exam for other colon problems.  If you have had an infection related to diverticulosis, treatment may include increasing the fiber in your diet, taking supplements, or taking medicines. This information is not intended to replace advice given to you by your health care provider. Make sure you discuss any questions you have with your health care provider. Document Released: 03/02/2004 Document Revised: 04/24/2016 Document Reviewed: 04/24/2016 Elsevier Interactive Patient Education  2019 [place], Care After These instructions provide you with information about caring for yourself after your procedure. Your health care provider may also give you more specific instructions. Your treatment has been planned according to current medical practices, but problems sometimes occur. Call your health care provider if you have any problems or questions after your procedure. What can I expect after the procedure? After your procedure, you may:  Feel sleepy for several hours.  Feel clumsy and have poor balance for several hours.  Feel forgetful about what happened after the procedure.  Have poor judgment for several hours.  Feel nauseous or vomit.  Have a sore throat if you had a breathing tube during the procedure. Follow these instructions at home: For at least 24 hours after the procedure:      Have a responsible adult stay with you. It is important to have someone help care for you until you are awake and alert.  Rest as needed.  Do not: ? Participate in activities in which you could fall or become injured. ? Drive. ? Use heavy machinery. ? Drink alcohol. ? Take sleeping pills or medicines that cause drowsiness. ?  Make important decisions or sign legal documents. ? Take care of children on your own. Eating and drinking  Follow the diet that is recommended by your health care provider.  If  you vomit, drink water, juice, or soup when you can drink without vomiting.  Make sure you have little or no nausea before eating solid foods. General instructions  Take over-the-counter and prescription medicines only as told by your health care provider.  If you have sleep apnea, surgery and certain medicines can increase your risk for breathing problems. Follow instructions from your health care provider about wearing your sleep device: ? Anytime you are sleeping, including during daytime naps. ? While taking prescription pain medicines, sleeping medicines, or medicines that make you drowsy.  If you smoke, do not smoke without supervision.  Keep all follow-up visits as told by your health care provider. This is important. Contact a health care provider if:  You keep feeling nauseous or you keep vomiting.  You feel light-headed.  You develop a rash.  You have a fever. Get help right away if:  You have trouble breathing. Summary  For several hours after your procedure, you may feel sleepy and have poor judgment.  Have a responsible adult stay with you for at least 24 hours or until you are awake and alert. This information is not intended to replace advice given to you by your health care provider. Make sure you discuss any questions you have with your health care provider. Document Released: 09/26/2015 Document Revised: 01/19/2017 Document Reviewed: 09/26/2015 Elsevier Interactive Patient Education  2019 [employer].

## 2018-08-12 NOTE — Anesthesia Postprocedure Evaluation (Signed)
Anesthesia Post Note  Patient: Thomas Carey  Procedure(s) Performed: COLONOSCOPY WITH PROPOFOL (N/A ) POLYPECTOMY  Patient location during evaluation: PACU Anesthesia Type: MAC Level of consciousness: awake and alert and patient cooperative Pain management: satisfactory to patient Vital Signs Assessment: post-procedure vital signs reviewed and stable Respiratory status: spontaneous breathing Cardiovascular status: stable Postop Assessment: no apparent nausea or vomiting Anesthetic complications: no     Last Vitals:  Vitals:   08/12/18 0840 08/12/18 1036  BP: 131/63 (!) 106/53  Pulse: 63 (!) 58  Resp: 20 17  Temp: 37 C 36.6 C  SpO2: 94% 100%    Last Pain:  Vitals:   08/12/18 1036  TempSrc:   PainSc: 0-No pain                 Quina Wilbourne

## 2018-08-12 NOTE — Op Note (Signed)
[facility] Patient Name: Thomas Carey Procedure Date: 08/12/2018 9:47 AM MRN: [id number] Date of Birth: [birth year] Attending MD: Norvel Richards , MD CSN: [id number] Age: 68 Admit Type: Outpatient Procedure:                Colonoscopy Indications:              High risk colon cancer surveillance: Personal                            history of colonic polyps Providers:                Norvel Richards, MD, Otis Peak B. Sharon Seller, RN,                            Aram Candela Referring MD:             Edwinna Areola. Hall MD Medicines:                Propofol per Anesthesia Complications:            No immediate complications. Estimated Blood Loss:     Estimated blood loss was minimal. Procedure:                Pre-Anesthesia Assessment:                           - Prior to the procedure, a History and Physical                            was performed, and patient medications and                            allergies were reviewed. The patient's tolerance of                            previous anesthesia was also reviewed. The risks                            and benefits of the procedure and the sedation                            options and risks were discussed with the patient.                            All questions were answered, and informed consent                            was obtained. Prior Anticoagulants: The patient has                            taken no previous anticoagulant or antiplatelet                            agents. ASA Grade Assessment: III - A patient with  severe systemic disease. After reviewing the risks                            and benefits, the patient was deemed in                            satisfactory condition to undergo the procedure.                           After obtaining informed consent, the colonoscope                            was passed under direct vision. Throughout the   procedure, the patient's blood pressure, pulse, and                            oxygen saturations were monitored continuously. The                            CF-[id number] ([id number]) scope was introduced through                            the and advanced to the the cecum, identified by                            appendiceal orifice and ileocecal valve. The                            colonoscopy was performed without difficulty. The                            patient tolerated the procedure well. The quality                            of the bowel preparation was adequate. Scope In: 10:11:11 AM Scope Out: 10:29:39 AM Total Procedure Duration: 0 hours 18 minutes 28 seconds  Findings:      The perianal and digital rectal examinations were normal. Redundant,       elongated colon. External abdominal pressure required to reach the cecum.      Three semi-pedunculated polyps were found in the rectum and descending       colon. The polyps were 5 to 7 mm in size. These polyps were removed with       a cold snare. Resection and retrieval were complete. Estimated blood       loss was minimal.      Scattered small-mouthed diverticula were found in the sigmoid colon.      Non-bleeding internal hemorrhoids were found during retroflexion. The       hemorrhoids were moderate, medium-sized and Grade I (internal       hemorrhoids that do not prolapse).      The exam was otherwise without abnormality on direct and retroflexion       views. Impression:               - Three 5 to 7 mm polyps in the rectum and in the  descending colon, removed with a cold snare.                            Resected and retrieved.                           - Diverticulosis in the sigmoid colon.                           - Non-bleeding internal hemorrhoids. Redundant                            colon.                           - The examination was otherwise normal on direct                            and  retroflexion views. Moderate Sedation:      Moderate (conscious) sedation was personally administered by an       anesthesia professional. The following parameters were monitored: oxygen       saturation, heart rate, blood pressure, respiratory rate, EKG, adequacy       of pulmonary ventilation, and response to care. Recommendation:           - Patient has a contact number available for                            emergencies. The signs and symptoms of potential                            delayed complications were discussed with the                            patient. Return to normal activities tomorrow.                            Written discharge instructions were provided to the                            patient.                           - Resume previous diet.                           - Continue present medications.                           - Repeat colonoscopy date to be determined after                            pending pathology results are reviewed for                            surveillance.                           -  Return to GI office (date not yet determined). Procedure Code(s):        --- Professional ---                           [phone], Colonoscopy, flexible; with removal of                            tumor(s), polyp(s), or other lesion(s) by snare                            technique Diagnosis Code(s):        --- Professional ---                           Z86.010, Personal history of colonic polyps                           K62.1, Rectal polyp                           D12.4, Benign neoplasm of descending colon                           K64.0, First degree hemorrhoids                           K57.30, Diverticulosis of large intestine without                            perforation or abscess without bleeding CPT copyright 2018 American Medical Association. All rights reserved. The codes documented in this report are preliminary and upon coder review may  be  revised to meet current compliance requirements. Cristopher Estimable. Rourk, MD Norvel Richards, MD 08/12/2018 10:37:27 AM This report has been signed electronically. Number of Addenda: 0

## 2018-08-12 NOTE — Transfer of Care (Addendum)
Immediate Anesthesia Transfer of Care Note  Patient: Thomas Carey  Procedure(s) Performed: COLONOSCOPY WITH PROPOFOL (N/A ) POLYPECTOMY  Patient Location: PACU  Anesthesia Type:MAC  Level of Consciousness: awake and patient cooperative  Airway & Oxygen Therapy: Patient Spontanous Breathing Nasal cannula  Post-op Assessment: Report given to RN and Post -op Vital signs reviewed and stable  Post vital signs: Reviewed and stable  Last Vitals:  Vitals Value Taken Time  BP    Temp    Pulse    Resp    SpO2      Last Pain:  Vitals:   08/12/18 1003  TempSrc:   PainSc: 0-No pain         Complications: No apparent anesthesia complications

## 2018-08-12 NOTE — Anesthesia Procedure Notes (Signed)
Procedure Name: MAC Date/Time: 08/12/2018 10:01 AM Performed by: Vista Deck, CRNA Pre-anesthesia Checklist: Patient identified, Emergency Drugs available, Suction available, Timeout performed and Patient being monitored Patient Re-evaluated:Patient Re-evaluated prior to induction Oxygen Delivery Method: Non-rebreather mask

## 2018-08-14 ENCOUNTER — Encounter (HOSPITAL_COMMUNITY): Payer: Self-pay | Admitting: Internal Medicine

## 2018-08-15 ENCOUNTER — Encounter: Payer: Self-pay | Admitting: Internal Medicine

## 2018-08-20 DIAGNOSIS — I10 Essential (primary) hypertension: Secondary | ICD-10-CM | POA: Diagnosis not present

## 2018-08-20 DIAGNOSIS — G4730 Sleep apnea, unspecified: Secondary | ICD-10-CM | POA: Diagnosis not present

## 2018-08-20 DIAGNOSIS — Z0001 Encounter for general adult medical examination with abnormal findings: Secondary | ICD-10-CM | POA: Diagnosis not present

## 2018-08-20 DIAGNOSIS — E1151 Type 2 diabetes mellitus with diabetic peripheral angiopathy without gangrene: Secondary | ICD-10-CM | POA: Diagnosis not present

## 2018-08-20 DIAGNOSIS — E7849 Other hyperlipidemia: Secondary | ICD-10-CM | POA: Diagnosis not present

## 2018-08-20 DIAGNOSIS — Z1389 Encounter for screening for other disorder: Secondary | ICD-10-CM | POA: Diagnosis not present

## 2018-08-20 DIAGNOSIS — E785 Hyperlipidemia, unspecified: Secondary | ICD-10-CM | POA: Diagnosis not present

## 2018-09-11 DIAGNOSIS — J069 Acute upper respiratory infection, unspecified: Secondary | ICD-10-CM | POA: Diagnosis not present

## 2019-02-21 DIAGNOSIS — R310 Gross hematuria: Secondary | ICD-10-CM | POA: Diagnosis not present

## 2019-02-21 DIAGNOSIS — N390 Urinary tract infection, site not specified: Secondary | ICD-10-CM | POA: Diagnosis not present

## 2019-02-21 DIAGNOSIS — E119 Type 2 diabetes mellitus without complications: Secondary | ICD-10-CM | POA: Diagnosis not present

## 2019-02-27 DIAGNOSIS — E1151 Type 2 diabetes mellitus with diabetic peripheral angiopathy without gangrene: Secondary | ICD-10-CM | POA: Diagnosis not present

## 2019-02-27 DIAGNOSIS — Z23 Encounter for immunization: Secondary | ICD-10-CM | POA: Diagnosis not present

## 2019-02-27 DIAGNOSIS — I10 Essential (primary) hypertension: Secondary | ICD-10-CM | POA: Diagnosis not present

## 2019-02-27 DIAGNOSIS — E785 Hyperlipidemia, unspecified: Secondary | ICD-10-CM | POA: Diagnosis not present

## 2019-03-19 DIAGNOSIS — I4891 Unspecified atrial fibrillation: Secondary | ICD-10-CM | POA: Diagnosis not present

## 2019-03-19 DIAGNOSIS — I10 Essential (primary) hypertension: Secondary | ICD-10-CM | POA: Diagnosis not present

## 2019-03-19 DIAGNOSIS — E119 Type 2 diabetes mellitus without complications: Secondary | ICD-10-CM | POA: Diagnosis not present

## 2019-03-19 DIAGNOSIS — E782 Mixed hyperlipidemia: Secondary | ICD-10-CM | POA: Diagnosis not present

## 2019-04-19 DIAGNOSIS — E1151 Type 2 diabetes mellitus with diabetic peripheral angiopathy without gangrene: Secondary | ICD-10-CM | POA: Diagnosis not present

## 2019-04-19 DIAGNOSIS — I10 Essential (primary) hypertension: Secondary | ICD-10-CM | POA: Diagnosis not present

## 2019-04-19 DIAGNOSIS — E7849 Other hyperlipidemia: Secondary | ICD-10-CM | POA: Diagnosis not present

## 2019-04-19 DIAGNOSIS — E11621 Type 2 diabetes mellitus with foot ulcer: Secondary | ICD-10-CM | POA: Diagnosis not present

## 2019-05-19 DIAGNOSIS — I10 Essential (primary) hypertension: Secondary | ICD-10-CM | POA: Diagnosis not present

## 2019-05-19 DIAGNOSIS — E7849 Other hyperlipidemia: Secondary | ICD-10-CM | POA: Diagnosis not present

## 2019-05-19 DIAGNOSIS — E1151 Type 2 diabetes mellitus with diabetic peripheral angiopathy without gangrene: Secondary | ICD-10-CM | POA: Diagnosis not present

## 2019-07-04 ENCOUNTER — Other Ambulatory Visit: Payer: Self-pay | Admitting: *Deleted

## 2019-07-04 NOTE — Patient Outreach (Signed)
[place] [facility]) Care Management  07/04/2019  MARCIS PETTEYS [birth year] [id number]   RN Health Coach attempted screening outreach call to patient.  Patient was unavailable. HIPPA compliance voicemail message left with return callback number.  Plan: RN will call patient again within 10 days.  [place] Care Management [phone]

## 2019-07-04 NOTE — Patient Outreach (Signed)
  [facility]) Care Management  07/04/2019  Thomas Carey 04/13/1951 [id number]  RN Health Coach received telephone call from patient.  [employer] compliance verified. RN Health Coach explained the service available by [facility]. Per patient he is doing good. Patient stated that he is taking his medications as prescribed with no problem paying for them. Per patient he does not need any services at this time but has agreed to receiving a pamphlet.  Plan: RN will send unsuccessful outreach letter RN will send brochure  [facility] [phone]

## 2019-07-09 ENCOUNTER — Ambulatory Visit: Payer: Self-pay | Admitting: *Deleted

## 2019-07-20 DIAGNOSIS — E7849 Other hyperlipidemia: Secondary | ICD-10-CM | POA: Diagnosis not present

## 2019-07-20 DIAGNOSIS — I10 Essential (primary) hypertension: Secondary | ICD-10-CM | POA: Diagnosis not present

## 2019-07-20 DIAGNOSIS — E1151 Type 2 diabetes mellitus with diabetic peripheral angiopathy without gangrene: Secondary | ICD-10-CM | POA: Diagnosis not present

## 2019-08-17 DIAGNOSIS — I10 Essential (primary) hypertension: Secondary | ICD-10-CM | POA: Diagnosis not present

## 2019-08-17 DIAGNOSIS — E1151 Type 2 diabetes mellitus with diabetic peripheral angiopathy without gangrene: Secondary | ICD-10-CM | POA: Diagnosis not present

## 2019-08-20 DIAGNOSIS — E785 Hyperlipidemia, unspecified: Secondary | ICD-10-CM | POA: Diagnosis not present

## 2019-08-20 DIAGNOSIS — Z0001 Encounter for general adult medical examination with abnormal findings: Secondary | ICD-10-CM | POA: Diagnosis not present

## 2019-08-20 DIAGNOSIS — E1151 Type 2 diabetes mellitus with diabetic peripheral angiopathy without gangrene: Secondary | ICD-10-CM | POA: Diagnosis not present

## 2019-08-20 DIAGNOSIS — Z1389 Encounter for screening for other disorder: Secondary | ICD-10-CM | POA: Diagnosis not present

## 2019-08-20 DIAGNOSIS — E782 Mixed hyperlipidemia: Secondary | ICD-10-CM | POA: Diagnosis not present

## 2019-08-20 DIAGNOSIS — E119 Type 2 diabetes mellitus without complications: Secondary | ICD-10-CM | POA: Diagnosis not present

## 2019-08-20 DIAGNOSIS — I10 Essential (primary) hypertension: Secondary | ICD-10-CM | POA: Diagnosis not present

## 2019-11-17 DIAGNOSIS — I10 Essential (primary) hypertension: Secondary | ICD-10-CM | POA: Diagnosis not present

## 2019-11-17 DIAGNOSIS — Z7984 Long term (current) use of oral hypoglycemic drugs: Secondary | ICD-10-CM | POA: Diagnosis not present

## 2019-11-17 DIAGNOSIS — E1151 Type 2 diabetes mellitus with diabetic peripheral angiopathy without gangrene: Secondary | ICD-10-CM | POA: Diagnosis not present

## 2020-02-17 DIAGNOSIS — Z7984 Long term (current) use of oral hypoglycemic drugs: Secondary | ICD-10-CM | POA: Diagnosis not present

## 2020-02-17 DIAGNOSIS — E1151 Type 2 diabetes mellitus with diabetic peripheral angiopathy without gangrene: Secondary | ICD-10-CM | POA: Diagnosis not present

## 2020-02-17 DIAGNOSIS — I10 Essential (primary) hypertension: Secondary | ICD-10-CM | POA: Diagnosis not present

## 2020-04-17 DIAGNOSIS — I10 Essential (primary) hypertension: Secondary | ICD-10-CM | POA: Diagnosis not present

## 2020-04-17 DIAGNOSIS — E1151 Type 2 diabetes mellitus with diabetic peripheral angiopathy without gangrene: Secondary | ICD-10-CM | POA: Diagnosis not present

## 2020-04-17 DIAGNOSIS — E7849 Other hyperlipidemia: Secondary | ICD-10-CM | POA: Diagnosis not present

## 2020-04-19 DIAGNOSIS — I10 Essential (primary) hypertension: Secondary | ICD-10-CM | POA: Diagnosis not present

## 2020-04-19 DIAGNOSIS — E119 Type 2 diabetes mellitus without complications: Secondary | ICD-10-CM | POA: Diagnosis not present

## 2020-04-19 DIAGNOSIS — Z23 Encounter for immunization: Secondary | ICD-10-CM | POA: Diagnosis not present

## 2020-05-18 DIAGNOSIS — E1151 Type 2 diabetes mellitus with diabetic peripheral angiopathy without gangrene: Secondary | ICD-10-CM | POA: Diagnosis not present

## 2020-05-18 DIAGNOSIS — I10 Essential (primary) hypertension: Secondary | ICD-10-CM | POA: Diagnosis not present

## 2020-05-18 DIAGNOSIS — E7849 Other hyperlipidemia: Secondary | ICD-10-CM | POA: Diagnosis not present

## 2020-06-18 DIAGNOSIS — I10 Essential (primary) hypertension: Secondary | ICD-10-CM | POA: Diagnosis not present

## 2020-06-18 DIAGNOSIS — E1151 Type 2 diabetes mellitus with diabetic peripheral angiopathy without gangrene: Secondary | ICD-10-CM | POA: Diagnosis not present

## 2020-06-18 DIAGNOSIS — E7849 Other hyperlipidemia: Secondary | ICD-10-CM | POA: Diagnosis not present

## 2020-07-17 DIAGNOSIS — I10 Essential (primary) hypertension: Secondary | ICD-10-CM | POA: Diagnosis not present

## 2020-07-17 DIAGNOSIS — E7849 Other hyperlipidemia: Secondary | ICD-10-CM | POA: Diagnosis not present

## 2020-07-17 DIAGNOSIS — E1151 Type 2 diabetes mellitus with diabetic peripheral angiopathy without gangrene: Secondary | ICD-10-CM | POA: Diagnosis not present

## 2020-09-15 DIAGNOSIS — E1151 Type 2 diabetes mellitus with diabetic peripheral angiopathy without gangrene: Secondary | ICD-10-CM | POA: Diagnosis not present

## 2020-09-15 DIAGNOSIS — E7849 Other hyperlipidemia: Secondary | ICD-10-CM | POA: Diagnosis not present

## 2020-09-15 DIAGNOSIS — I10 Essential (primary) hypertension: Secondary | ICD-10-CM | POA: Diagnosis not present

## 2020-12-16 DIAGNOSIS — E782 Mixed hyperlipidemia: Secondary | ICD-10-CM | POA: Diagnosis not present

## 2020-12-16 DIAGNOSIS — I10 Essential (primary) hypertension: Secondary | ICD-10-CM | POA: Diagnosis not present

## 2020-12-16 DIAGNOSIS — E1151 Type 2 diabetes mellitus with diabetic peripheral angiopathy without gangrene: Secondary | ICD-10-CM | POA: Diagnosis not present

## 2021-01-17 DIAGNOSIS — E1151 Type 2 diabetes mellitus with diabetic peripheral angiopathy without gangrene: Secondary | ICD-10-CM | POA: Diagnosis not present

## 2021-01-17 DIAGNOSIS — Z0001 Encounter for general adult medical examination with abnormal findings: Secondary | ICD-10-CM | POA: Diagnosis not present

## 2021-01-17 DIAGNOSIS — Z1389 Encounter for screening for other disorder: Secondary | ICD-10-CM | POA: Diagnosis not present

## 2021-01-17 DIAGNOSIS — L97509 Non-pressure chronic ulcer of other part of unspecified foot with unspecified severity: Secondary | ICD-10-CM | POA: Diagnosis not present

## 2021-01-17 DIAGNOSIS — G4730 Sleep apnea, unspecified: Secondary | ICD-10-CM | POA: Diagnosis not present

## 2021-03-18 DIAGNOSIS — E1151 Type 2 diabetes mellitus with diabetic peripheral angiopathy without gangrene: Secondary | ICD-10-CM | POA: Diagnosis not present

## 2021-03-18 DIAGNOSIS — E782 Mixed hyperlipidemia: Secondary | ICD-10-CM | POA: Diagnosis not present

## 2021-03-18 DIAGNOSIS — I10 Essential (primary) hypertension: Secondary | ICD-10-CM | POA: Diagnosis not present
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
## 2021-03-22 ENCOUNTER — Encounter: Payer: Self-pay | Admitting: Urology

## 2021-03-22 ENCOUNTER — Other Ambulatory Visit: Payer: Self-pay

## 2021-03-22 ENCOUNTER — Ambulatory Visit: Payer: Medicare Other | Admitting: Urology

## 2021-03-22 VITALS — BP 137/76 | HR 76 | Temp 98.7°F | Ht 72.0 in | Wt 364.2 lb

## 2021-03-22 DIAGNOSIS — R3915 Urgency of urination: Secondary | ICD-10-CM | POA: Diagnosis not present

## 2021-03-22 DIAGNOSIS — R972 Elevated prostate specific antigen [PSA]: Secondary | ICD-10-CM

## 2021-03-22 DIAGNOSIS — R310 Gross hematuria: Secondary | ICD-10-CM

## 2021-03-22 LAB — URINALYSIS, ROUTINE W REFLEX MICROSCOPIC
Bilirubin, UA: NEGATIVE
Glucose, UA: NEGATIVE
Nitrite, UA: NEGATIVE
Specific Gravity, UA: >1.03 — ABNORMAL HIGH (ref 1.005–1.030)
Urobilinogen, Ur: 0.2 mg/dL (ref 0.2–1.0)
pH, UA: 5.5 (ref 5.0–7.5)

## 2021-03-22 LAB — MICROSCOPIC EXAMINATION
Bacteria, UA: NONE SEEN
Renal Epithel, UA: NONE SEEN /hpf

## 2021-03-22 NOTE — Progress Notes (Signed)
Urological Symptom Review  Patient is experiencing the following symptoms: Frequent urination Hard to postpone urination Get up at night to urinate Blood in urine   Review of Systems  Gastrointestinal (upper)  : Negative for upper GI symptoms  Gastrointestinal (lower) : Negative for lower GI symptoms  Constitutional : Negative for symptoms  Skin: Negative for skin symptoms  Eyes: Negative for eye symptoms  Ear/Nose/Throat : Negative for Ear/Nose/Throat symptoms  Hematologic/Lymphatic: Negative for Hematologic/Lymphatic symptoms  Cardiovascular : Leg swelling  Respiratory : Cough  Endocrine: Negative for endocrine symptoms  Musculoskeletal: Negative for musculoskeletal symptoms  Neurological: Negative for neurological symptoms  Psychologic: Negative for psychiatric symptoms

## 2021-03-23 LAB — PSA: Prostate Specific Ag, Serum: 7.4 ng/mL — ABNORMAL HIGH (ref 0.0–4.0)

## 2021-03-28 ENCOUNTER — Telehealth: Payer: Self-pay

## 2021-03-28 NOTE — Telephone Encounter (Signed)
-----   Message from Franchot Gallo, MD sent at 03/28/2021  9:43 AM EDT ----- Notify pt--PSA still a bit high--7.4. I dont think we need to repeat bx @ this point.  ----- Message ----- From: Dorisann Frames, RN Sent: 03/23/2021   9:54 AM EDT To: Franchot Gallo, MD  Please review

## 2021-03-28 NOTE — Telephone Encounter (Signed)
Called and lvm regarding lab results

## 2021-03-29 NOTE — Telephone Encounter (Signed)
Patient returned call to office. Notified of results.

## 2021-04-21 DIAGNOSIS — Z23 Encounter for immunization: Secondary | ICD-10-CM | POA: Diagnosis not present

## 2021-05-17 ENCOUNTER — Ambulatory Visit (HOSPITAL_COMMUNITY): Admission: RE | Admit: 2021-05-17 | Payer: Medicare Other | Source: Ambulatory Visit

## 2021-05-20 ENCOUNTER — Other Ambulatory Visit: Payer: Self-pay

## 2021-05-20 ENCOUNTER — Ambulatory Visit (HOSPITAL_COMMUNITY)
Admission: RE | Admit: 2021-05-20 | Discharge: 2021-05-20 | Disposition: A | Discharge status: Home / Self Care | Payer: Medicare Other | Source: Ambulatory Visit | Attending: Urology | Admitting: Urology

## 2021-05-20 DIAGNOSIS — R310 Gross hematuria: Secondary | ICD-10-CM | POA: Diagnosis not present

## 2021-05-20 DIAGNOSIS — R319 Hematuria, unspecified: Secondary | ICD-10-CM | POA: Diagnosis not present

## 2021-05-20 DIAGNOSIS — K8020 Calculus of gallbladder without cholecystitis without obstruction: Secondary | ICD-10-CM | POA: Diagnosis not present

## 2021-05-20 DIAGNOSIS — N200 Calculus of kidney: Secondary | ICD-10-CM | POA: Diagnosis not present

## 2021-05-20 LAB — POCT I-STAT CREATININE: Creatinine, Ser: 1.1 mg/dL (ref 0.61–1.24)

## 2021-05-20 MED ORDER — IOHEXOL 350 MG/ML SOLN
100.0000 mL | Freq: Once | INTRAVENOUS | Status: AC | PRN
  Administered 2021-05-20: 100 mL via INTRAVENOUS
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
## 2021-05-24 ENCOUNTER — Other Ambulatory Visit: Payer: Self-pay

## 2021-05-24 ENCOUNTER — Ambulatory Visit: Payer: Medicare Other | Admitting: Urology

## 2021-05-24 ENCOUNTER — Encounter: Payer: Self-pay | Admitting: Urology

## 2021-05-24 VITALS — BP 136/76 | HR 70

## 2021-05-24 DIAGNOSIS — R3915 Urgency of urination: Secondary | ICD-10-CM | POA: Diagnosis not present

## 2021-05-24 DIAGNOSIS — N200 Calculus of kidney: Secondary | ICD-10-CM | POA: Diagnosis not present

## 2021-05-24 DIAGNOSIS — R310 Gross hematuria: Secondary | ICD-10-CM

## 2021-05-24 LAB — URINALYSIS, ROUTINE W REFLEX MICROSCOPIC
Bilirubin, UA: NEGATIVE
Glucose, UA: NEGATIVE
Ketones, UA: NEGATIVE
Nitrite, UA: NEGATIVE
Protein,UA: NEGATIVE
RBC, UA: NEGATIVE
Specific Gravity, UA: 1.025 (ref 1.005–1.030)
Urobilinogen, Ur: 0.2 mg/dL (ref 0.2–1.0)
pH, UA: 5.5 (ref 5.0–7.5)

## 2021-05-24 LAB — MICROSCOPIC EXAMINATION: RBC, Urine: NONE SEEN /hpf (ref 0–2)

## 2021-05-24 NOTE — Progress Notes (Signed)
Urological Symptom Review  Patient is experiencing the following symptoms: Frequent urination Hard to postpone urination Get up at night to urinate Leakage of urine Blood in urine   Review of Systems  Gastrointestinal (upper)  : Negative for upper GI symptoms  Gastrointestinal (lower) : Diarrhea  Constitutional : Negative for symptoms  Skin: Negative for skin symptoms  Eyes: Negative for eye symptoms  Ear/Nose/Throat : Negative for Ear/Nose/Throat symptoms  Hematologic/Lymphatic: Negative for Hematologic/Lymphatic symptoms  Cardiovascular : Leg swelling  Respiratory : Negative for respiratory symptoms  Endocrine: Negative for endocrine symptoms  Musculoskeletal: Negative for musculoskeletal symptoms  Neurological: Negative for neurological symptoms  Psychologic: Negative for psychiatric symptoms

## 2021-06-14 DIAGNOSIS — N200 Calculus of kidney: Secondary | ICD-10-CM | POA: Diagnosis not present

## 2021-06-23 ENCOUNTER — Other Ambulatory Visit: Payer: Self-pay | Admitting: Urology

## 2021-08-18 ENCOUNTER — Telehealth: Payer: Self-pay

## 2021-08-18 NOTE — Telephone Encounter (Signed)
Patient called and made aware.  ?Low Oxalate Diet pamphlet sent to to patient via mail. ?

## 2021-08-18 NOTE — Telephone Encounter (Signed)
-----   Message from Franchot Gallo, MD sent at 08/18/2021 12:36 PM EST ----- ?Let patient know that his 24-hour urine result got to me.  I would recommend that he increase fluid intake, water is the best thing.  Additionally, limit the sodium/salt in the diet.  That brings extra calcium into the urine increasing the risk of stones.  Also, his oxalate level is high.  I would recommend that he look online at foods that contain high amounts of oxalate and limit them somewhat.  If an oxalate diet card is available, send that to him. ?----- Message ----- ?From: Iris Pert, LPN ?Sent: 06/23/2021   4:38 PM EST ?To: Franchot Gallo, MD, Dorisann Frames, RN ? ?[employer] ? ?

## 2021-09-09 ENCOUNTER — Ambulatory Visit: Payer: Medicare Other

## 2021-09-09 ENCOUNTER — Other Ambulatory Visit: Payer: Self-pay

## 2021-09-09 ENCOUNTER — Ambulatory Visit (HOSPITAL_COMMUNITY)
Admission: RE | Admit: 2021-09-09 | Discharge: 2021-09-09 | Disposition: A | Discharge status: Home / Self Care | Payer: Medicare Other | Source: Ambulatory Visit | Attending: Urology | Admitting: Urology

## 2021-09-09 DIAGNOSIS — I700 Atherosclerosis of aorta: Secondary | ICD-10-CM | POA: Diagnosis not present

## 2021-09-09 DIAGNOSIS — R109 Unspecified abdominal pain: Secondary | ICD-10-CM | POA: Diagnosis not present

## 2021-09-09 DIAGNOSIS — N200 Calculus of kidney: Secondary | ICD-10-CM | POA: Diagnosis not present

## 2021-09-20 ENCOUNTER — Encounter: Payer: Self-pay | Admitting: Urology

## 2021-09-20 ENCOUNTER — Ambulatory Visit: Payer: Medicare Other | Admitting: Urology

## 2021-09-20 VITALS — BP 126/83 | HR 81

## 2021-09-20 DIAGNOSIS — R972 Elevated prostate specific antigen [PSA]: Secondary | ICD-10-CM

## 2021-09-20 DIAGNOSIS — R3915 Urgency of urination: Secondary | ICD-10-CM

## 2021-09-20 DIAGNOSIS — N200 Calculus of kidney: Secondary | ICD-10-CM | POA: Diagnosis not present

## 2021-09-20 DIAGNOSIS — R310 Gross hematuria: Secondary | ICD-10-CM | POA: Diagnosis not present

## 2021-09-20 LAB — MICROSCOPIC EXAMINATION
Bacteria, UA: NONE SEEN
Epithelial Cells (non renal): NONE SEEN /hpf (ref 0–10)
Renal Epithel, UA: NONE SEEN /hpf

## 2021-09-20 LAB — URINALYSIS, ROUTINE W REFLEX MICROSCOPIC
Bilirubin, UA: NEGATIVE
Glucose, UA: NEGATIVE
Nitrite, UA: NEGATIVE
RBC, UA: NEGATIVE
Specific Gravity, UA: 1.025 (ref 1.005–1.030)
Urobilinogen, Ur: 0.2 mg/dL (ref 0.2–1.0)
pH, UA: 5.5 (ref 5.0–7.5)
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
## 2022-01-29 IMAGING — CT CT ABD-PEL WO/W CM
4 of 12 series · 11 of 46 positions shown, 17 images · IV contrast (Omnipaque or Isovue)
Comparison: None.

CLINICAL DATA: Intermittent hematuria.

EXAM:
CT ABDOMEN AND PELVIS WITHOUT AND WITH CONTRAST
TECHNIQUE: Multidetector CT imaging of the abdomen and pelvis was performed
following the standard protocol before and following the bolus
administration of intravenous contrast.
CONTRAST:  100mL OMNIPAQUE IOHEXOL 350 MG/ML SOLN

[Series 2: axial pre · axial · non-contrast · 0.98mm/px · z∈[+1041,+1346]mm · 4 of 103 slices shown, 9 images]
[im 21/103  soft-tissue]
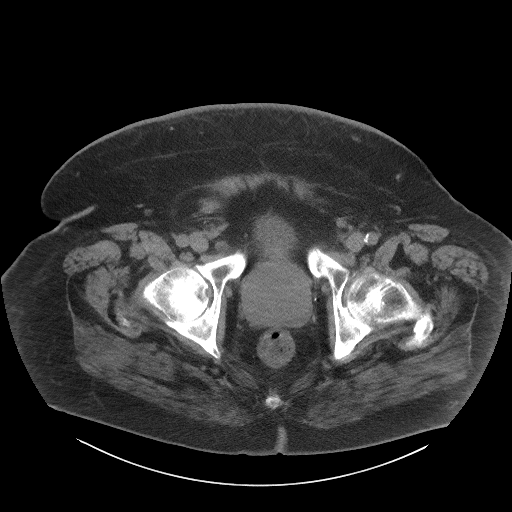
[im 21/103  lung]
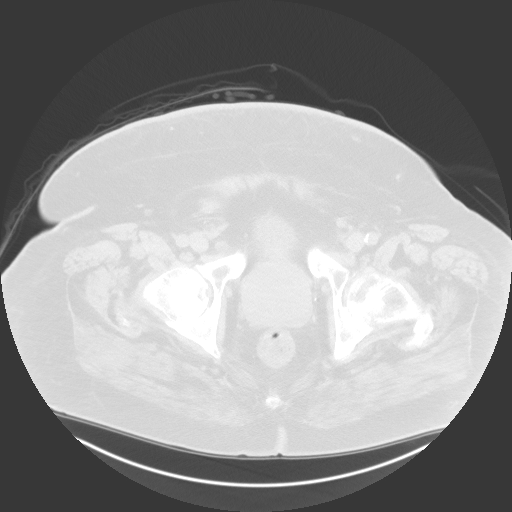
[im 21/103  bone]
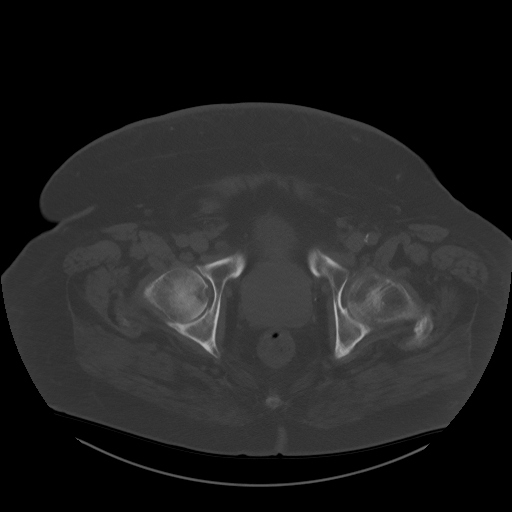
[im 41/103  soft-tissue]
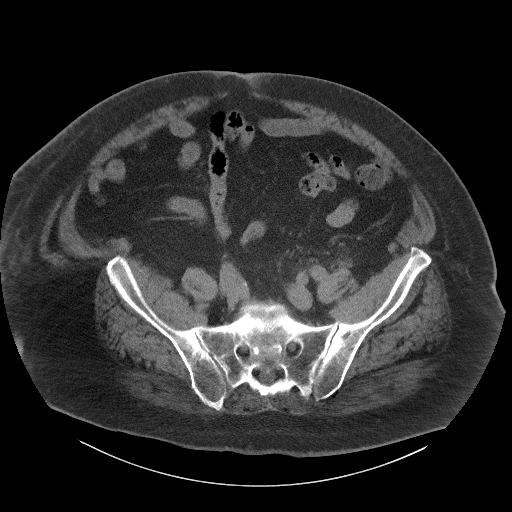
[im 41/103  lung]
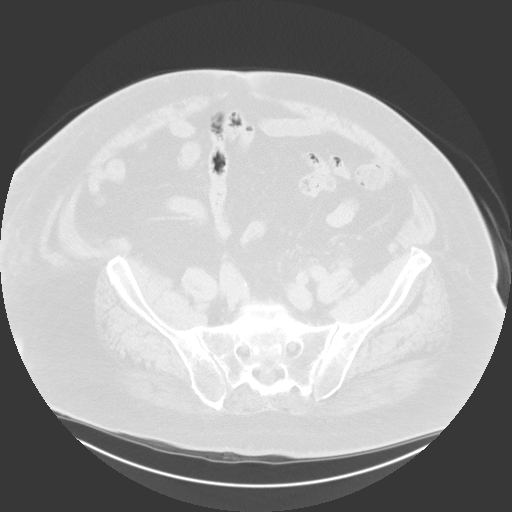
[im 62/103  soft-tissue]
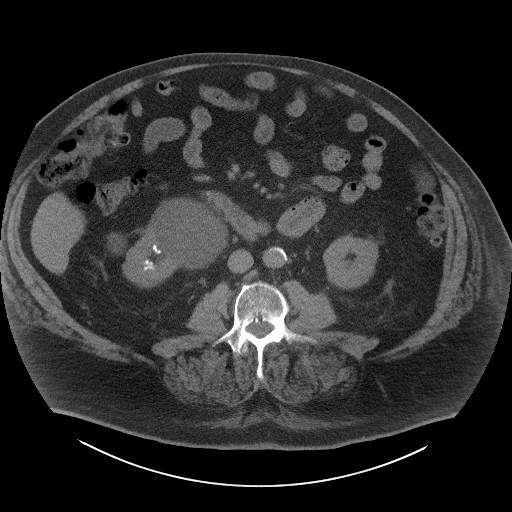
[im 62/103  lung]
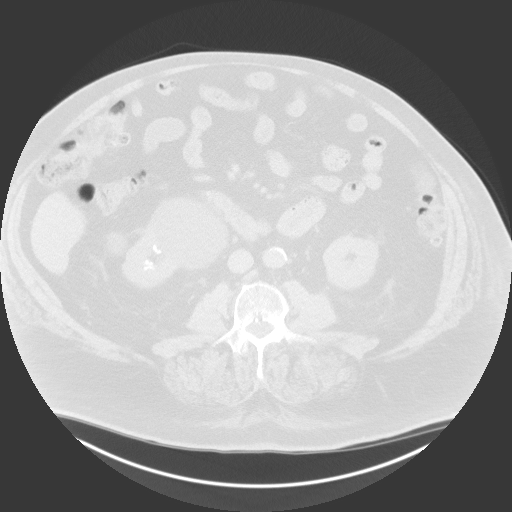
[im 82/103  soft-tissue]
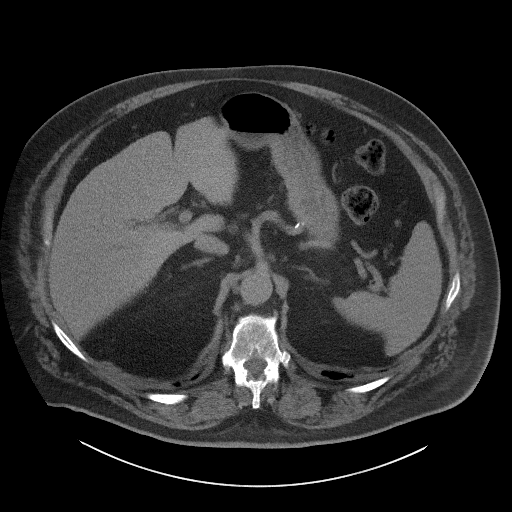
[im 82/103  lung]
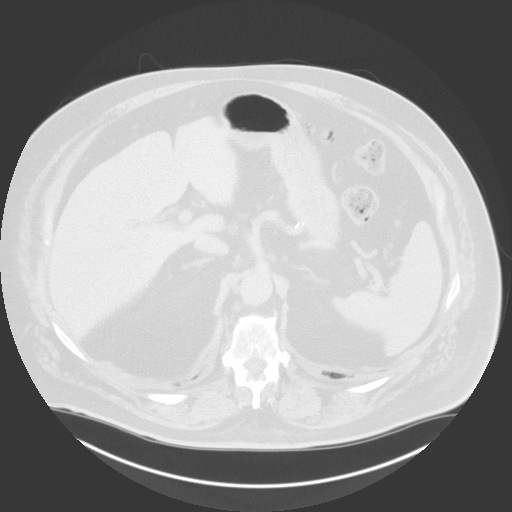

[Series 7: axial post · axial · 0.98mm/px · z∈[+1066,+1321]mm · 3 of 103 slices shown]
[im 26/103  soft-tissue]
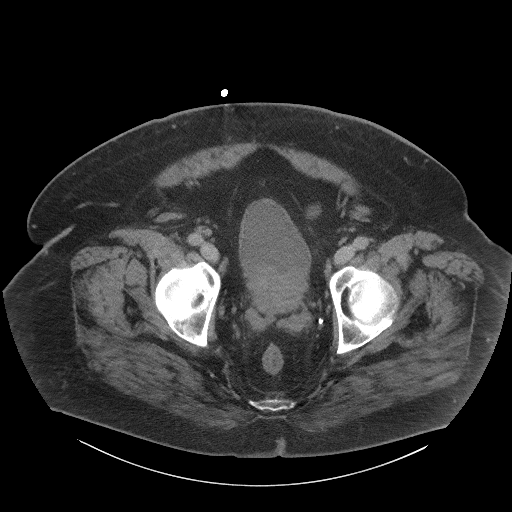
[im 52/103  soft-tissue]
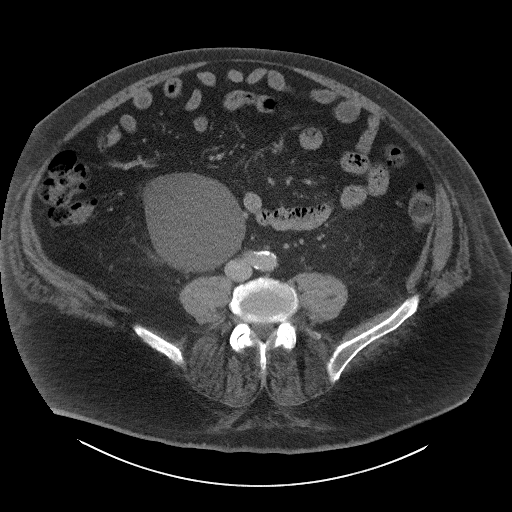
[im 77/103  soft-tissue]
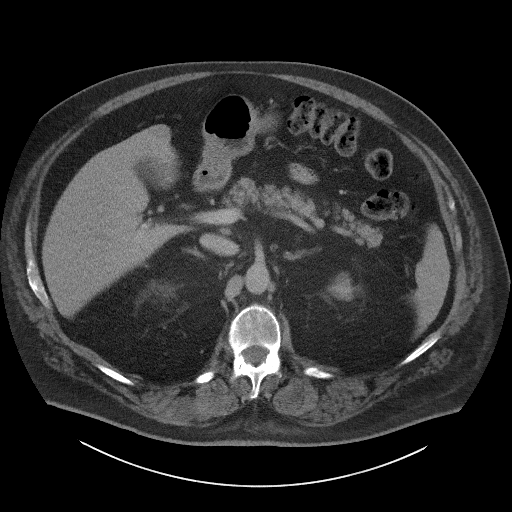

[Series 10: coronal post · coronal · 1.05mm/px · 2 of 152 slices shown, 3 images]
[im 51/152  soft-tissue]
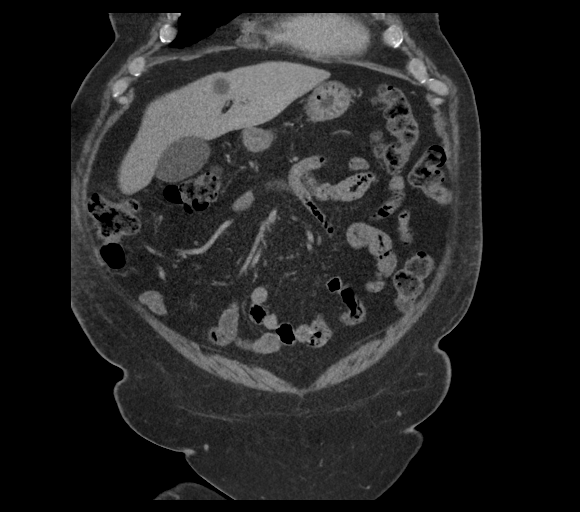
[im 51/152  bone]
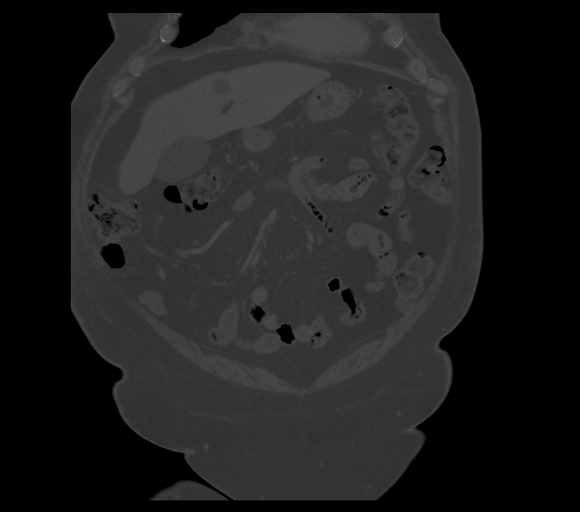
[im 101/152  soft-tissue]
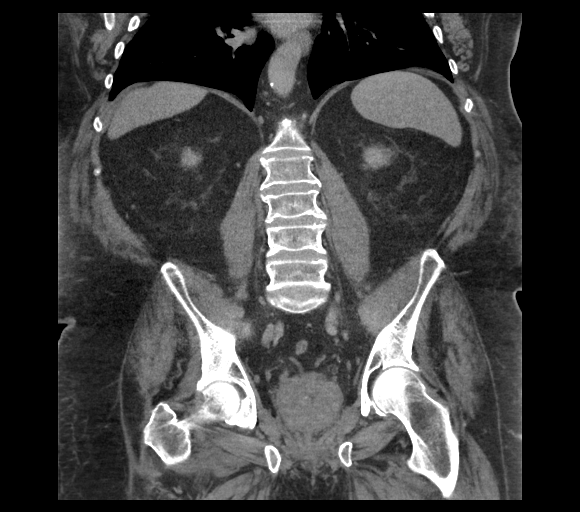

[Series 12: axial delay · axial · delayed · 0.98mm/px · z∈[+1066,+1196]mm · 2 of 103 slices shown]
[im 26/103  soft-tissue]
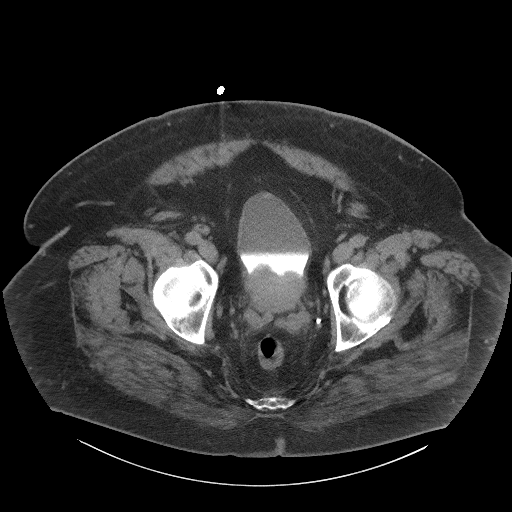
[im 52/103  soft-tissue]
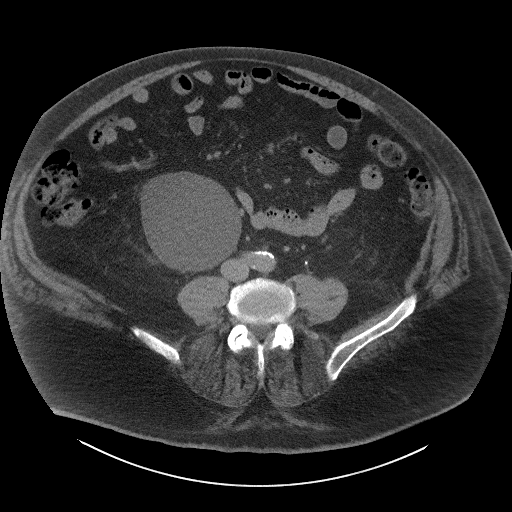

[11 of 46 positions shown; findings below may reference images not displayed]

FINDINGS: Lower chest: Solid 5 mm right middle lobe pulmonary nodule on image
[DATE]. Ground-glass 5 mm left lower lobe pulmonary nodule on image
[DATE].

Hepatobiliary: 3.1 cm left hepatic lobe cyst. No solid enhancing
hepatic lesion. Cholelithiasis without findings of acute
cholecystitis. No biliary ductal dilation.

Pancreas: No pancreatic ductal dilation or evidence of acute
inflammation.

Spleen: Within normal limits.

Adrenals/Urinary Tract: Bilateral adrenal glands are unremarkable.

No hydronephrosis. Nonobstructive RIGHT renal calculi measuring up
to 18 mm. No obstructive ureteral or bladder calculi visualized. No
left renal calculi. Lobular nonenhancing 10.7 cm right lower pole
renal cyst. Exophytic 3.7 cm right renal cyst. No solid enhancing
renal mass.

The kidneys demonstrate symmetric enhancement excretion of contrast.
No suspicious filling defect visualized within the opacified
portions of the collecting systems or ureters on delayed imaging.
However, the distal left ureter is not well opacified limiting
evaluation.

Urinary bladder is effaced by an enlarged prostate with median lobe
hypertrophy. Mild trabeculation of the urinary bladder wall.
Evaluation of the urinary bladder is somewhat limited by urine
contamination within this context there is no asymmetric wall
thickening or suspicious intraluminal filling defect visualized.

Stomach/Bowel: No enteric contrast was administered. Stomach is
unremarkable for degree of distension. No pathologic dilation of
small or large bowel. The appendix and terminal ileum are normal. No
evidence of acute bowel inflammation.

Vascular/Lymphatic: Aortic and branch vessel atherosclerosis without
abdominal aortic aneurysm. No pathologically enlarged abdominal or
pelvic lymph nodes.

Reproductive: Enlarged prostate measuring 6.7 x 6.3 x 8.9 cm with
median lobe hypertrophy.

Other: No significant abdominopelvic free fluid. Fat containing
umbilical hernia. Small fat containing bilateral inguinal hernias.

Musculoskeletal: Multilevel degenerative changes spine. Degenerative
changes bilateral SI joints with partial bony ankylosis.
Degenerative changes bilateral hips. No acute osseous abnormality.
IMPRESSION: 1. Nonobstructive RIGHT nephrolithiasis measuring up to 18 mm. No
obstructive ureteral or bladder calculi visualized.
2. No solid enhancing renal mass.
3. Enlarged prostate with median lobe hypertrophy. Mild
trabeculation of the urinary bladder wall suggesting chronic outflow
impedance.
4. Solid 5 mm right middle lobe pulmonary nodule and ground-glass 5
mm left lower lobe pulmonary nodule. No follow-up needed if patient
is low-risk (and has no known or suspected primary neoplasm).
Non-contrast chest CT can be considered in 12 months if patient is
high-risk. This recommendation follows the consensus statement:
Guidelines for Management of Incidental Pulmonary Nodules Detected
5. Cholelithiasis without findings of acute cholecystitis.
6. Aortic Atherosclerosis ([id number]-VM3.3).

## 2022-02-28 DIAGNOSIS — G4730 Sleep apnea, unspecified: Secondary | ICD-10-CM | POA: Diagnosis not present

## 2022-02-28 DIAGNOSIS — E1151 Type 2 diabetes mellitus with diabetic peripheral angiopathy without gangrene: Secondary | ICD-10-CM | POA: Diagnosis not present

## 2022-02-28 DIAGNOSIS — E11621 Type 2 diabetes mellitus with foot ulcer: Secondary | ICD-10-CM | POA: Diagnosis not present

## 2022-02-28 DIAGNOSIS — I10 Essential (primary) hypertension: Secondary | ICD-10-CM | POA: Diagnosis not present

## 2022-02-28 DIAGNOSIS — E782 Mixed hyperlipidemia: Secondary | ICD-10-CM | POA: Diagnosis not present

## 2022-02-28 DIAGNOSIS — E119 Type 2 diabetes mellitus without complications: Secondary | ICD-10-CM | POA: Diagnosis not present

## 2022-02-28 DIAGNOSIS — E785 Hyperlipidemia, unspecified: Secondary | ICD-10-CM | POA: Diagnosis not present

## 2022-02-28 DIAGNOSIS — Z0001 Encounter for general adult medical examination with abnormal findings: Secondary | ICD-10-CM | POA: Diagnosis not present

## 2022-02-28 DIAGNOSIS — Z23 Encounter for immunization: Secondary | ICD-10-CM | POA: Diagnosis not present

## 2022-03-21 ENCOUNTER — Ambulatory Visit: Payer: Medicare Other | Admitting: Urology
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
## 2022-03-28 ENCOUNTER — Ambulatory Visit (INDEPENDENT_AMBULATORY_CARE_PROVIDER_SITE_OTHER): Payer: Medicare Other | Admitting: Urology

## 2022-03-28 ENCOUNTER — Encounter: Payer: Self-pay | Admitting: Urology

## 2022-03-28 VITALS — BP 138/83 | HR 105 | Ht 72.0 in | Wt 364.0 lb

## 2022-03-28 DIAGNOSIS — N200 Calculus of kidney: Secondary | ICD-10-CM | POA: Diagnosis not present

## 2022-03-28 DIAGNOSIS — N401 Enlarged prostate with lower urinary tract symptoms: Secondary | ICD-10-CM

## 2022-03-28 DIAGNOSIS — R972 Elevated prostate specific antigen [PSA]: Secondary | ICD-10-CM

## 2022-03-28 LAB — URINALYSIS, ROUTINE W REFLEX MICROSCOPIC
Bilirubin, UA: NEGATIVE
Glucose, UA: NEGATIVE
Nitrite, UA: NEGATIVE
Specific Gravity, UA: 1.02 (ref 1.005–1.030)
Urobilinogen, Ur: 1 mg/dL (ref 0.2–1.0)
pH, UA: 5 (ref 5.0–7.5)

## 2022-03-28 LAB — MICROSCOPIC EXAMINATION
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
## 2022-03-29 ENCOUNTER — Ambulatory Visit (HOSPITAL_COMMUNITY)
Admission: RE | Admit: 2022-03-29 | Discharge: 2022-03-29 | Disposition: A | Discharge status: Home / Self Care | Payer: Medicare Other | Source: Ambulatory Visit | Attending: Urology | Admitting: Urology

## 2022-03-29 DIAGNOSIS — N200 Calculus of kidney: Secondary | ICD-10-CM | POA: Diagnosis not present

## 2022-03-29 DIAGNOSIS — M160 Bilateral primary osteoarthritis of hip: Secondary | ICD-10-CM | POA: Diagnosis not present

## 2022-03-29 LAB — PSA: Prostate Specific Ag, Serum: 7.1 ng/mL — ABNORMAL HIGH (ref 0.0–4.0)

## 2022-03-30 ENCOUNTER — Telehealth: Payer: Self-pay

## 2022-03-30 NOTE — Telephone Encounter (Signed)
-----   Message from Franchot Gallo, MD sent at 03/30/2022 11:17 AM EDT ----- Let pt know that PSA is stable @ 7.1 ----- Message ----- From: Audie Box, CMA Sent: 03/29/2022   8:54 AM EDT To: Franchot Gallo, MD  Please review.

## 2022-03-30 NOTE — Telephone Encounter (Signed)
Unable to reach pt via phone, letter sent informing pt.

## 2022-04-11 ENCOUNTER — Telehealth: Payer: Self-pay

## 2022-04-11 NOTE — Telephone Encounter (Signed)
Left voicemail requesting patient call back in regards to results.  Will try to reach patient at a later time.

## 2022-04-11 NOTE — Telephone Encounter (Signed)
-----   Message from Stephen Dahlstedt, MD sent at 04/11/2022 12:13 PM EDT ----- Let patient know that his kidney x-ray showed the stone was the same size. ----- Message ----- From: Astou Lada R, CMA Sent: 04/03/2022   8:35 AM EDT To: Stephen Dahlstedt, MD  Please review.   

## 2022-04-13 ENCOUNTER — Telehealth: Payer: Self-pay

## 2022-04-13 NOTE — Telephone Encounter (Signed)
Patient returned call to office and notified of results per Dr. Diona Fanti. Patient voiced understanding.

## 2022-04-13 NOTE — Telephone Encounter (Signed)
Patient aware of MD response to his imaging, he will f/u as scheduled.

## 2022-04-13 NOTE — Telephone Encounter (Signed)
-----   Message from Franchot Gallo, MD sent at 04/11/2022 12:13 PM EDT ----- Let patient know that his kidney x-ray showed the stone was the same size. ----- Message ----- From: Audie Box, CMA Sent: 04/03/2022   8:35 AM EDT To: Franchot Gallo, MD  Please review.

## 2023-04-03 ENCOUNTER — Ambulatory Visit: Payer: Medicare Other | Admitting: Urology

## 2023-04-03 DIAGNOSIS — R310 Gross hematuria: Secondary | ICD-10-CM

## 2023-04-03 DIAGNOSIS — R3915 Urgency of urination: Secondary | ICD-10-CM

## 2023-04-03 DIAGNOSIS — N200 Calculus of kidney: Secondary | ICD-10-CM

## 2023-04-03 DIAGNOSIS — R972 Elevated prostate specific antigen [PSA]: Secondary | ICD-10-CM

## 2023-04-24 ENCOUNTER — Other Ambulatory Visit: Payer: Self-pay

## 2023-04-24 ENCOUNTER — Ambulatory Visit: Payer: Medicare Other | Admitting: Urology

## 2023-04-24 ENCOUNTER — Encounter: Payer: Self-pay | Admitting: Urology

## 2023-04-24 ENCOUNTER — Ambulatory Visit (HOSPITAL_COMMUNITY)
Admission: RE | Admit: 2023-04-24 | Discharge: 2023-04-24 | Disposition: A | Discharge status: Home / Self Care | Payer: Medicare Other | Source: Ambulatory Visit | Attending: Urology | Admitting: Urology

## 2023-04-24 VITALS — BP 114/72 | HR 67 | Ht 72.0 in | Wt 364.0 lb

## 2023-04-24 DIAGNOSIS — I878 Other specified disorders of veins: Secondary | ICD-10-CM | POA: Diagnosis not present

## 2023-04-24 DIAGNOSIS — N200 Calculus of kidney: Secondary | ICD-10-CM | POA: Diagnosis not present

## 2023-04-24 DIAGNOSIS — R310 Gross hematuria: Secondary | ICD-10-CM

## 2023-04-24 DIAGNOSIS — N401 Enlarged prostate with lower urinary tract symptoms: Secondary | ICD-10-CM

## 2023-04-24 DIAGNOSIS — R972 Elevated prostate specific antigen [PSA]: Secondary | ICD-10-CM | POA: Diagnosis not present

## 2023-04-24 DIAGNOSIS — R3915 Urgency of urination: Secondary | ICD-10-CM

## 2023-04-24 LAB — URINALYSIS, ROUTINE W REFLEX MICROSCOPIC
Bilirubin, UA: NEGATIVE
Glucose, UA: NEGATIVE
Ketones, UA: NEGATIVE
Nitrite, UA: NEGATIVE
RBC, UA: NEGATIVE
Specific Gravity, UA: 1.025 (ref 1.005–1.030)
Urobilinogen, Ur: 1 mg/dL (ref 0.2–1.0)
pH, UA: 6 (ref 5.0–7.5)

## 2023-04-24 LAB — MICROSCOPIC EXAMINATION: Bacteria, UA: NONE SEEN
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
## 2023-04-30 DIAGNOSIS — I10 Essential (primary) hypertension: Secondary | ICD-10-CM | POA: Diagnosis not present

## 2023-04-30 DIAGNOSIS — E782 Mixed hyperlipidemia: Secondary | ICD-10-CM | POA: Diagnosis not present

## 2023-04-30 DIAGNOSIS — E785 Hyperlipidemia, unspecified: Secondary | ICD-10-CM | POA: Diagnosis not present

## 2023-04-30 DIAGNOSIS — E1159 Type 2 diabetes mellitus with other circulatory complications: Secondary | ICD-10-CM | POA: Diagnosis not present

## 2023-04-30 DIAGNOSIS — Z23 Encounter for immunization: Secondary | ICD-10-CM | POA: Diagnosis not present

## 2023-04-30 DIAGNOSIS — Z0001 Encounter for general adult medical examination with abnormal findings: Secondary | ICD-10-CM | POA: Diagnosis not present

## 2023-05-14 ENCOUNTER — Telehealth: Payer: Self-pay

## 2023-05-14 NOTE — Telephone Encounter (Signed)
-----   Message from Bertram Millard Dahlstedt sent at 05/14/2023 10:24 AM EST ----- Let pt know we got psa results--level lower than last year--now 6.5--good news ----- Message ----- From: Grier Rocher, CMA Sent: 05/08/2023   1:12 PM EST To: Marcine Matar, MD  See as Lorain Childes ----- Message ----- From: Berle Mull Sent: 05/08/2023  10:25 AM EST To: [facility] [place] [facility]

## 2023-05-14 NOTE — Telephone Encounter (Signed)
Requested pt call back for results.

## 2023-05-15 NOTE — Telephone Encounter (Signed)
Patient aware of MD response to labs and will follow up as scheduled.

## 2023-07-25 ENCOUNTER — Encounter (INDEPENDENT_AMBULATORY_CARE_PROVIDER_SITE_OTHER): Payer: Self-pay | Admitting: *Deleted

## 2023-12-13 DIAGNOSIS — R030 Elevated blood-pressure reading, without diagnosis of hypertension: Secondary | ICD-10-CM | POA: Diagnosis not present

## 2023-12-13 DIAGNOSIS — S91104A Unspecified open wound of right lesser toe(s) without damage to nail, initial encounter: Secondary | ICD-10-CM | POA: Diagnosis not present

## 2023-12-17 ENCOUNTER — Other Ambulatory Visit: Payer: Self-pay

## 2023-12-17 ENCOUNTER — Ambulatory Visit (HOSPITAL_COMMUNITY): Attending: Family Medicine | Admitting: Physical Therapy

## 2023-12-17 DIAGNOSIS — S91135S Puncture wound without foreign body of left lesser toe(s) without damage to nail, sequela: Secondary | ICD-10-CM | POA: Diagnosis not present

## 2023-12-17 DIAGNOSIS — R600 Localized edema: Secondary | ICD-10-CM | POA: Diagnosis not present

## 2023-12-17 DIAGNOSIS — R601 Generalized edema: Secondary | ICD-10-CM | POA: Diagnosis not present
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
## 2023-12-17 NOTE — Addendum Note (Signed)
 Addended by: NELWYN MONTIE PARAS on: 12/17/2023 12:19 PM   Modules accepted: Orders

## 2023-12-20 ENCOUNTER — Ambulatory Visit (HOSPITAL_COMMUNITY): Attending: Family Medicine | Admitting: Physical Therapy

## 2023-12-20 DIAGNOSIS — S91135S Puncture wound without foreign body of left lesser toe(s) without damage to nail, sequela: Secondary | ICD-10-CM | POA: Diagnosis not present

## 2023-12-20 DIAGNOSIS — R600 Localized edema: Secondary | ICD-10-CM | POA: Insufficient documentation
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
## 2023-12-25 ENCOUNTER — Ambulatory Visit (HOSPITAL_COMMUNITY): Admitting: Physical Therapy

## 2023-12-25 DIAGNOSIS — S91135S Puncture wound without foreign body of left lesser toe(s) without damage to nail, sequela: Secondary | ICD-10-CM

## 2023-12-25 DIAGNOSIS — R600 Localized edema: Secondary | ICD-10-CM
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
## 2023-12-28 ENCOUNTER — Ambulatory Visit (HOSPITAL_COMMUNITY)

## 2023-12-28 ENCOUNTER — Encounter (HOSPITAL_COMMUNITY): Payer: Self-pay

## 2023-12-28 DIAGNOSIS — S91135S Puncture wound without foreign body of left lesser toe(s) without damage to nail, sequela: Secondary | ICD-10-CM | POA: Diagnosis not present

## 2023-12-28 DIAGNOSIS — R600 Localized edema: Secondary | ICD-10-CM | POA: Diagnosis not present
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
## 2024-01-01 ENCOUNTER — Ambulatory Visit (HOSPITAL_COMMUNITY): Admitting: Physical Therapy

## 2024-01-01 DIAGNOSIS — R600 Localized edema: Secondary | ICD-10-CM

## 2024-01-01 DIAGNOSIS — S91135S Puncture wound without foreign body of left lesser toe(s) without damage to nail, sequela: Secondary | ICD-10-CM | POA: Diagnosis not present
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
## 2024-01-03 ENCOUNTER — Ambulatory Visit (HOSPITAL_COMMUNITY): Admitting: Physical Therapy

## 2024-01-03 DIAGNOSIS — S91135S Puncture wound without foreign body of left lesser toe(s) without damage to nail, sequela: Secondary | ICD-10-CM | POA: Diagnosis not present

## 2024-01-03 DIAGNOSIS — R600 Localized edema: Secondary | ICD-10-CM
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
## 2024-01-08 ENCOUNTER — Ambulatory Visit (HOSPITAL_COMMUNITY): Admitting: Physical Therapy

## 2024-01-08 DIAGNOSIS — R600 Localized edema: Secondary | ICD-10-CM | POA: Diagnosis not present

## 2024-01-08 DIAGNOSIS — S91135S Puncture wound without foreign body of left lesser toe(s) without damage to nail, sequela: Secondary | ICD-10-CM
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
## 2024-01-10 ENCOUNTER — Ambulatory Visit (HOSPITAL_COMMUNITY): Admitting: Physical Therapy

## 2024-01-10 DIAGNOSIS — S91135S Puncture wound without foreign body of left lesser toe(s) without damage to nail, sequela: Secondary | ICD-10-CM

## 2024-01-10 DIAGNOSIS — R600 Localized edema: Secondary | ICD-10-CM | POA: Diagnosis not present
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
## 2024-01-15 ENCOUNTER — Ambulatory Visit (HOSPITAL_COMMUNITY): Admitting: Physical Therapy

## 2024-01-15 DIAGNOSIS — R600 Localized edema: Secondary | ICD-10-CM | POA: Diagnosis not present

## 2024-01-15 DIAGNOSIS — S91135S Puncture wound without foreign body of left lesser toe(s) without damage to nail, sequela: Secondary | ICD-10-CM | POA: Diagnosis not present
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
## 2024-01-17 ENCOUNTER — Ambulatory Visit (HOSPITAL_COMMUNITY)

## 2024-01-17 ENCOUNTER — Encounter (HOSPITAL_COMMUNITY): Payer: Self-pay

## 2024-01-17 ENCOUNTER — Ambulatory Visit (HOSPITAL_COMMUNITY): Admitting: Physical Therapy

## 2024-01-17 DIAGNOSIS — R600 Localized edema: Secondary | ICD-10-CM

## 2024-01-17 DIAGNOSIS — S91135S Puncture wound without foreign body of left lesser toe(s) without damage to nail, sequela: Secondary | ICD-10-CM
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
## 2024-01-21 ENCOUNTER — Ambulatory Visit (HOSPITAL_COMMUNITY): Attending: Family Medicine | Admitting: Physical Therapy

## 2024-01-21 DIAGNOSIS — S91135S Puncture wound without foreign body of left lesser toe(s) without damage to nail, sequela: Secondary | ICD-10-CM | POA: Diagnosis not present

## 2024-01-21 DIAGNOSIS — R600 Localized edema: Secondary | ICD-10-CM | POA: Diagnosis not present
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
## 2024-01-25 ENCOUNTER — Ambulatory Visit (HOSPITAL_COMMUNITY): Admitting: Physical Therapy

## 2024-01-25 DIAGNOSIS — S91135S Puncture wound without foreign body of left lesser toe(s) without damage to nail, sequela: Secondary | ICD-10-CM | POA: Diagnosis not present

## 2024-01-25 DIAGNOSIS — R600 Localized edema: Secondary | ICD-10-CM
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
## 2024-01-29 ENCOUNTER — Ambulatory Visit (HOSPITAL_COMMUNITY): Admitting: Physical Therapy

## 2024-01-31 ENCOUNTER — Ambulatory Visit (HOSPITAL_COMMUNITY): Admitting: Physical Therapy

## 2024-02-06 ENCOUNTER — Ambulatory Visit (HOSPITAL_COMMUNITY): Admitting: Physical Therapy

## 2024-02-08 ENCOUNTER — Ambulatory Visit (HOSPITAL_COMMUNITY)

## 2024-04-09 ENCOUNTER — Encounter: Payer: Self-pay | Admitting: Physician Assistant

## 2024-04-09 ENCOUNTER — Ambulatory Visit: Admitting: Physician Assistant

## 2024-04-09 VITALS — BP 127/81 | HR 51 | Temp 97.7°F | Ht 72.0 in | Wt 340.0 lb

## 2024-04-09 DIAGNOSIS — I10 Essential (primary) hypertension: Secondary | ICD-10-CM | POA: Diagnosis not present

## 2024-04-09 DIAGNOSIS — Z7984 Long term (current) use of oral hypoglycemic drugs: Secondary | ICD-10-CM | POA: Diagnosis not present

## 2024-04-09 DIAGNOSIS — G4733 Obstructive sleep apnea (adult) (pediatric): Secondary | ICD-10-CM | POA: Diagnosis not present

## 2024-04-09 DIAGNOSIS — E1151 Type 2 diabetes mellitus with diabetic peripheral angiopathy without gangrene: Secondary | ICD-10-CM

## 2024-04-09 DIAGNOSIS — Z23 Encounter for immunization: Secondary | ICD-10-CM

## 2024-04-09 DIAGNOSIS — Z7689 Persons encountering health services in other specified circumstances: Secondary | ICD-10-CM

## 2024-04-09 DIAGNOSIS — E782 Mixed hyperlipidemia: Secondary | ICD-10-CM | POA: Diagnosis not present

## 2024-04-09 DIAGNOSIS — L97509 Non-pressure chronic ulcer of other part of unspecified foot with unspecified severity: Secondary | ICD-10-CM | POA: Insufficient documentation

## 2024-04-09 DIAGNOSIS — E785 Hyperlipidemia, unspecified: Secondary | ICD-10-CM | POA: Insufficient documentation

## 2024-04-09 MED ORDER — GEMFIBROZIL 600 MG PO TABS: 600.0000 mg | ORAL_TABLET | Freq: Two times a day (BID) | ORAL | 1 refills | Status: AC

## 2024-04-09 MED ORDER — ATORVASTATIN CALCIUM 40 MG PO TABS: 40.0000 mg | ORAL_TABLET | Freq: Every day | ORAL | 1 refills | Status: AC

## 2024-04-09 MED ORDER — METFORMIN HCL 500 MG PO TABS: 250.0000 mg | ORAL_TABLET | Freq: Two times a day (BID) | ORAL | 1 refills | Status: AC

## 2024-04-09 MED ORDER — LISINOPRIL-HYDROCHLOROTHIAZIDE 20-12.5 MG PO TABS: 1.0000 | ORAL_TABLET | Freq: Every day | ORAL | 1 refills | Status: AC

## 2024-04-09 NOTE — Assessment & Plan Note (Signed)
 No previous records for review, updating lab work today. Patient denies symptomatology.  Continue current management.  Patient is on aspirin, ACEI, statin. Lipid panel, A1c, microalbumin ordered today.   Increase dietary efforts and physical activity. Routine diabetic retinopathy screening: up-to-date. Foot exam and monofilament test- declined today. Patient did not want to take socks and shoes off.  Follow up in 6 months.
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
## 2024-04-09 NOTE — Assessment & Plan Note (Signed)
 Using outdated CPAP machine and interested in a new machine. Hesitant for new sleep study but acknowledges need for update. Declines referral to neurology for updated sleep study and CPAP management.

## 2024-04-09 NOTE — Assessment & Plan Note (Signed)
 127/81 Controlled. Continue current medications. No change in management. Discussed DASH diet and dietary sodium restrictions.  Increase dietary efforts and physical activity. Lab work today.  Monitor for new chest pain, shortness of breath, headache, or visual changes.

## 2024-04-09 NOTE — Assessment & Plan Note (Signed)
 Stable. Continue with current management without changes. Discussed healthy diet and lifestyle. Lipid panel today.

## 2024-04-11 ENCOUNTER — Ambulatory Visit: Payer: Self-pay | Admitting: Physician Assistant

## 2024-04-11 LAB — COMPREHENSIVE METABOLIC PANEL WITH GFR
ALT: 7 IU/L (ref 0–44)
AST: 15 IU/L (ref 0–40)
Albumin: 4 g/dL (ref 3.8–4.8)
Alkaline Phosphatase: 101 IU/L (ref 47–123)
BUN/Creatinine Ratio: 16 (ref 10–24)
BUN: 16 mg/dL (ref 8–27)
Bilirubin Total: 0.6 mg/dL (ref 0.0–1.2)
CO2: 26 mmol/L (ref 20–29)
Calcium: 9.9 mg/dL (ref 8.6–10.2)
Chloride: 101 mmol/L (ref 96–106)
Creatinine, Ser: 0.98 mg/dL (ref 0.76–1.27)
Globulin, Total: 2.4 g/dL (ref 1.5–4.5)
Glucose: 94 mg/dL (ref 70–99)
Potassium: 5.1 mmol/L (ref 3.5–5.2)
Sodium: 141 mmol/L (ref 134–144)
Total Protein: 6.4 g/dL (ref 6.0–8.5)
eGFR: 80 mL/min/1.73 (ref >59)

## 2024-04-11 LAB — LIPID PANEL
Chol/HDL Ratio: 2.4 ratio (ref 0.0–5.0)
Cholesterol, Total: 132 mg/dL (ref 100–199)
HDL: 56 mg/dL (ref >39)
LDL Chol Calc (NIH): 64 mg/dL (ref 0–99)
Triglycerides: 56 mg/dL (ref 0–149)
VLDL Cholesterol Cal: 12 mg/dL (ref 5–40)

## 2024-04-11 LAB — CBC WITH DIFFERENTIAL/PLATELET
Basophils Absolute: 0 x10E3/uL (ref 0.0–0.2)
Basos: 0 %
EOS (ABSOLUTE): 0.1 x10E3/uL (ref 0.0–0.4)
Eos: 2 %
Hematocrit: 49.6 % (ref 37.5–51.0)
Hemoglobin: 16.3 g/dL (ref 13.0–17.7)
Immature Grans (Abs): 0 x10E3/uL (ref 0.0–0.1)
Immature Granulocytes: 0 %
Lymphocytes Absolute: 2.1 x10E3/uL (ref 0.7–3.1)
Lymphs: 32 %
MCH: 31.8 pg (ref 26.6–33.0)
MCHC: 32.9 g/dL (ref 31.5–35.7)
MCV: 97 fL (ref 79–97)
Monocytes Absolute: 0.5 x10E3/uL (ref 0.1–0.9)
Monocytes: 8 %
Neutrophils Absolute: 3.9 x10E3/uL (ref 1.4–7.0)
Neutrophils: 58 %
Platelets: 224 x10E3/uL (ref 150–450)
RBC: 5.12 x10E6/uL (ref 4.14–5.80)
RDW: 13.1 % (ref 11.6–15.4)
WBC: 6.7 x10E3/uL (ref 3.4–10.8)

## 2024-04-11 LAB — HEMOGLOBIN A1C
Est. average glucose Bld gHb Est-mCnc: 120 mg/dL
Hgb A1c MFr Bld: 5.8 % — ABNORMAL HIGH (ref 4.8–5.6)

## 2024-04-11 LAB — MICROALBUMIN / CREATININE URINE RATIO
Creatinine, Urine: 247.1 mg/dL
Microalb/Creat Ratio: 59 mg/g{creat} — AB (ref 0–29)
Microalbumin, Urine: 146.2 ug/mL

## 2024-04-15 NOTE — Progress Notes (Signed)
 Called patient and left voicemail for him give us  a call back to go over lab results.
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
## 2024-04-22 ENCOUNTER — Ambulatory Visit: Payer: Medicare Other | Admitting: Urology

## 2024-04-22 ENCOUNTER — Ambulatory Visit (HOSPITAL_COMMUNITY)
Admission: RE | Admit: 2024-04-22 | Discharge: 2024-04-22 | Disposition: A | Discharge status: Home / Self Care | Source: Ambulatory Visit | Attending: Urology | Admitting: Urology

## 2024-04-22 VITALS — BP 130/78 | HR 61

## 2024-04-22 DIAGNOSIS — N200 Calculus of kidney: Secondary | ICD-10-CM | POA: Diagnosis not present

## 2024-04-22 DIAGNOSIS — R310 Gross hematuria: Secondary | ICD-10-CM

## 2024-04-22 DIAGNOSIS — R3915 Urgency of urination: Secondary | ICD-10-CM

## 2024-04-22 DIAGNOSIS — R972 Elevated prostate specific antigen [PSA]: Secondary | ICD-10-CM | POA: Diagnosis not present

## 2024-04-22 DIAGNOSIS — N401 Enlarged prostate with lower urinary tract symptoms: Secondary | ICD-10-CM | POA: Diagnosis not present

## 2024-04-22 LAB — URINALYSIS, ROUTINE W REFLEX MICROSCOPIC
Bilirubin, UA: NEGATIVE
Glucose, UA: NEGATIVE
Nitrite, UA: NEGATIVE
Protein,UA: NEGATIVE
Specific Gravity, UA: 1.025 (ref 1.005–1.030)
Urobilinogen, Ur: 0.2 mg/dL (ref 0.2–1.0)
pH, UA: 5.5 (ref 5.0–7.5)

## 2024-04-22 LAB — MICROSCOPIC EXAMINATION: Bacteria, UA: NONE SEEN

## 2024-04-23 ENCOUNTER — Ambulatory Visit: Payer: Self-pay

## 2024-04-23 LAB — PSA: Prostate Specific Ag, Serum: 6.6 ng/mL — ABNORMAL HIGH (ref 0.0–4.0)

## 2024-04-23 NOTE — Telephone Encounter (Signed)
-----   Message from [name] sent at 04/23/2024  9:14 AM EST ----- Let patient know his PSA is better at 6.6 ----- Message ----- From: Interface, [facility] Lab Results In Sent: 04/22/2024   3:36 PM EST To: Garnette Shack, MD

## 2024-04-23 NOTE — Telephone Encounter (Signed)
 Called pt to give him PSA per MD Dahlstedt lvm to c/b no DPR on file

## 2024-04-23 NOTE — Telephone Encounter (Signed)
 Pt return called and was made aware of Dr. Matilda response. Pt voiced understanding

## 2024-04-25 ENCOUNTER — Telehealth: Payer: Self-pay

## 2024-04-25 NOTE — Progress Notes (Signed)
 Mailbox is full.

## 2024-04-25 NOTE — Telephone Encounter (Signed)
 Results are not back currently

## 2024-04-25 NOTE — Telephone Encounter (Signed)
 Copied from CRM #[id number]. Topic: Clinical - Lab/Test Results >> Apr 24, 2024  5:00 PM Delon DASEN wrote: Reason for CRM: patient returned call, no message left, waiting for xray results.

## 2024-04-29 NOTE — Progress Notes (Signed)
 Called patient. Left a voicemail for them to give us  a call back to discuss lab results.

## 2024-05-05 ENCOUNTER — Telehealth: Payer: Self-pay

## 2024-05-05 NOTE — Telephone Encounter (Signed)
 Copied from CRM #[id number]. Topic: General - Other >> May 05, 2024  4:14 PM Yolanda T wrote: Reason for CRM: patient said he was returning call made to his today. Per CAL will need to call patient back

## 2024-05-05 NOTE — Progress Notes (Signed)
 Called patient and left voicemail to give [place] a call back.

## 2024-10-08 ENCOUNTER — Ambulatory Visit: Admitting: Physician Assistant
